# Patient Record
Sex: Female | Born: 1975
Health system: Southern US, Community
[De-identification: ages and names within clinical notes are randomized; demographics above are authoritative.]

## PROBLEM LIST (undated history)

## (undated) DIAGNOSIS — O24419 Gestational diabetes mellitus in pregnancy, unspecified control: Secondary | ICD-10-CM

## (undated) DIAGNOSIS — D649 Anemia, unspecified: Secondary | ICD-10-CM

## (undated) DIAGNOSIS — R002 Palpitations: Secondary | ICD-10-CM

## (undated) DIAGNOSIS — F419 Anxiety disorder, unspecified: Secondary | ICD-10-CM

## (undated) DIAGNOSIS — K589 Irritable bowel syndrome without diarrhea: Secondary | ICD-10-CM

## (undated) HISTORY — DX: Anemia, unspecified: D64.9

## (undated) HISTORY — PX: WISDOM TOOTH EXTRACTION: SHX21

## (undated) HISTORY — DX: Gestational diabetes mellitus in pregnancy, unspecified control: O24.419

## (undated) HISTORY — PX: ABCESS DRAINAGE: SHX399

## (undated) HISTORY — DX: Irritable bowel syndrome, unspecified: K58.9

## (undated) HISTORY — DX: Palpitations: R00.2

## (undated) HISTORY — PX: ANKLE SURGERY: SHX546

---

## 1998-11-16 ENCOUNTER — Other Ambulatory Visit: Admission: RE | Admit: 1998-11-16 | Discharge: 1998-11-16 | Payer: Self-pay | Admitting: *Deleted

## 1999-04-09 ENCOUNTER — Other Ambulatory Visit: Admission: RE | Admit: 1999-04-09 | Discharge: 1999-04-09 | Payer: Self-pay | Admitting: Obstetrics and Gynecology

## 1999-04-12 ENCOUNTER — Other Ambulatory Visit: Admission: RE | Admit: 1999-04-12 | Discharge: 1999-04-12 | Payer: Self-pay | Admitting: Obstetrics and Gynecology

## 2014-02-04 ENCOUNTER — Other Ambulatory Visit: Payer: Self-pay | Admitting: Orthopedic Surgery

## 2014-02-04 DIAGNOSIS — S81009A Unspecified open wound, unspecified knee, initial encounter: Secondary | ICD-10-CM

## 2014-02-04 DIAGNOSIS — S81809A Unspecified open wound, unspecified lower leg, initial encounter: Principal | ICD-10-CM

## 2014-02-04 DIAGNOSIS — S91009A Unspecified open wound, unspecified ankle, initial encounter: Principal | ICD-10-CM

## 2014-02-08 ENCOUNTER — Ambulatory Visit
Admission: RE | Admit: 2014-02-08 | Discharge: 2014-02-08 | Disposition: A | Discharge status: Home / Self Care | Payer: PRIVATE HEALTH INSURANCE | Source: Ambulatory Visit | Attending: Orthopedic Surgery | Admitting: Orthopedic Surgery

## 2014-02-08 DIAGNOSIS — S91009A Unspecified open wound, unspecified ankle, initial encounter: Principal | ICD-10-CM

## 2014-02-08 DIAGNOSIS — S81009A Unspecified open wound, unspecified knee, initial encounter: Secondary | ICD-10-CM

## 2014-02-08 DIAGNOSIS — S81809A Unspecified open wound, unspecified lower leg, initial encounter: Principal | ICD-10-CM

## 2014-11-25 NOTE — L&D Delivery Note (Signed)
Delivery Note  SVD viable nake Apgars 9,9 over 2nd degree ML epis.  Placenta delivered spontaneously intact with 3VC. Repair with 2-0 Chromic with good support and hemostasis noted and R/V exam confirms.  PH art was sent.  [place] cord blood was done.  Mother and baby were doing well.  EBL 375cc  [name], MD

## 2015-02-16 IMAGING — CT CT ANKLE*R* W/O CM
2 of 4 series · 4 of 14 positions shown, 5 images · non-contrast
Comparison: None.

CLINICAL DATA: Ankle fracture with fixation 4 months ago.
Persistent drainage at wound, ceasing over the last week. Evaluate
for nonunion.

EXAM:
CT OF THE RIGHT ANKLE WITHOUT CONTRAST
TECHNIQUE: Multidetector CT imaging was performed according to the standard
protocol. Multiplanar CT image reconstructions were also generated.

[Series 3: ankle /foot detail · axial · 0.39mm/px · z∈[-51,-1]mm · 2 of 62 slices shown, 3 images]
[im 21/62  soft-tissue]
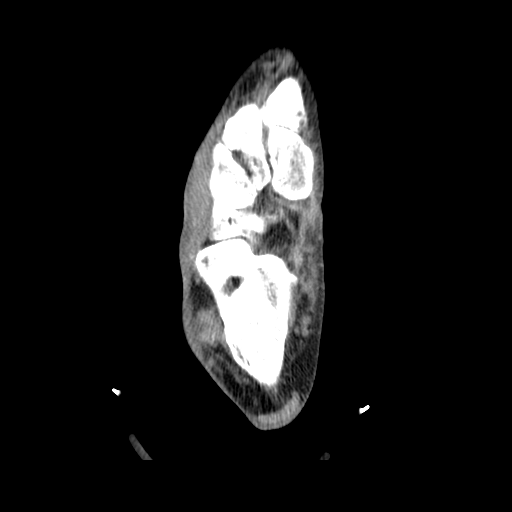
[im 21/62  bone]
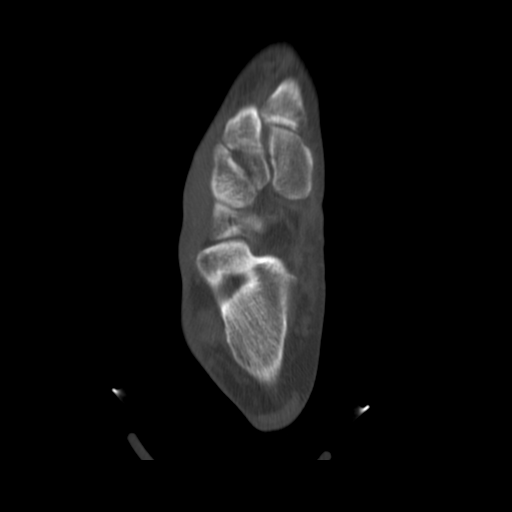
[im 41/62  bone]
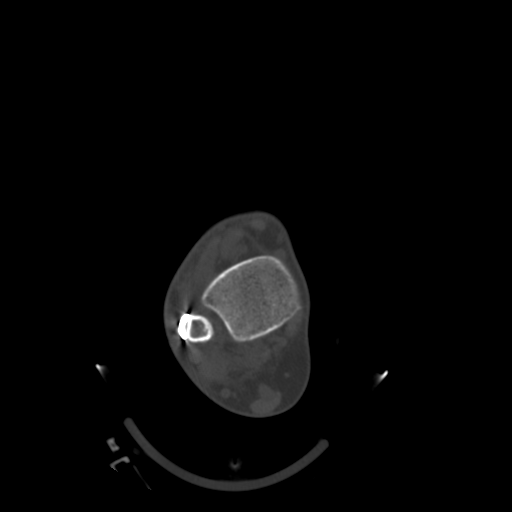

[Series 104: cor soft · coronal · 0.39mm/px · 2 of 65 slices shown]
[im 22/65  soft-tissue]
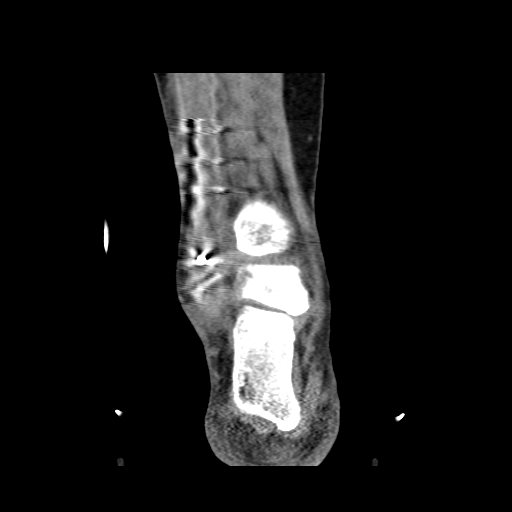
[im 43/65  soft-tissue]
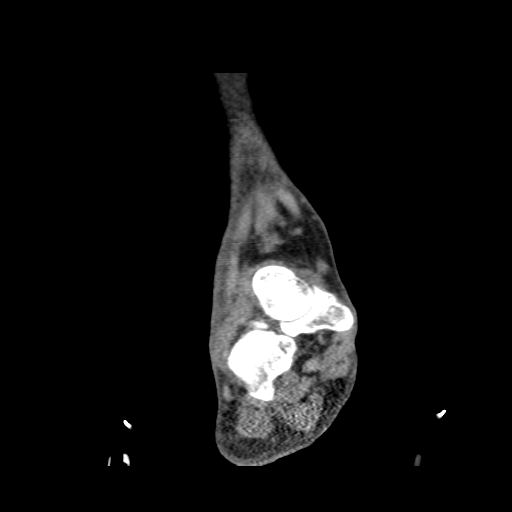

[4 of 14 positions shown; findings below may reference images not displayed]

FINDINGS: Distal fibular lateral plate and screws are well positioned without
loosening. The underlying oblique fracture of the distal fibula
appears largely healed with cortical bridging posteriorly. The
fracture line remains visible anteriorly. There is no fracture
displacement. There is no widening of the ankle mortise or
syndesmosis. Minimal subchondral lucency is present posterolaterally
in the talar dome. The overlying cortex appears intact. The tibial
plafond and and medial malleolus appear normal.

There is no ankle joint effusion or evidence of loose body. The
subtalar joint appears normal.

As evaluated by CT, all of the ankle tendons appear normal. There is
some soft tissue thickening inferior to the fibular tip. There is no
evidence of osseous sub fibular impingement or focal fluid
collection. Type 1 accessory navicular noted incidentally.
IMPRESSION: 1. The fracture of the distal fibula has largely healed with minimal
posttraumatic deformity. There is no evidence of hardware loosening
or displacement.
2. Possible small osteochondral lesion of the talar dome
posterolaterally. No displacement or overlying cortical abnormality
identified.
3. Soft tissue thickening inferior to the fibular tip without
typical distribution for impingement. The peroneal tendons appear
grossly normal.

## 2015-03-15 ENCOUNTER — Inpatient Hospital Stay (HOSPITAL_COMMUNITY)
Admission: AD | Admit: 2015-03-15 | Payer: PRIVATE HEALTH INSURANCE | Source: Ambulatory Visit | Admitting: Obstetrics and Gynecology

## 2015-03-15 LAB — OB RESULTS CONSOLE HIV ANTIBODY (ROUTINE TESTING): HIV: NONREACTIVE

## 2015-03-15 LAB — OB RESULTS CONSOLE HEPATITIS B SURFACE ANTIGEN: HEP B S AG: NEGATIVE

## 2015-03-15 LAB — OB RESULTS CONSOLE RPR: RPR: NONREACTIVE

## 2015-03-15 LAB — OB RESULTS CONSOLE RUBELLA ANTIBODY, IGM: RUBELLA: IMMUNE

## 2015-08-09 ENCOUNTER — Encounter: Payer: Managed Care, Other (non HMO) | Attending: Obstetrics and Gynecology

## 2015-08-09 VITALS — Ht 65.0 in | Wt 162.9 lb

## 2015-08-09 DIAGNOSIS — O24419 Gestational diabetes mellitus in pregnancy, unspecified control: Secondary | ICD-10-CM

## 2015-08-09 DIAGNOSIS — Z713 Dietary counseling and surveillance: Secondary | ICD-10-CM | POA: Insufficient documentation

## 2015-08-10 NOTE — Progress Notes (Signed)
  Patient was seen on 08/09/15 for Gestational Diabetes self-management . The following learning objectives were met by the patient :   States the definition of Gestational Diabetes  States why dietary management is important in controlling blood glucose  Describes the effects of carbohydrates on blood glucose levels  Demonstrates ability to create a balanced meal plan  Demonstrates carbohydrate counting   States when to check blood glucose levels  Demonstrates proper blood glucose monitoring techniques  States the effect of stress and exercise on blood glucose levels  States the importance of limiting caffeine and abstaining from alcohol and smoking  Plan:  Aim for 2 Carb Choices per meal (30 grams) +/- 1 either way for breakfast Aim for 3 Carb Choices per meal (45 grams) +/- 1 either way from lunch and dinner Aim for 1-2 Carbs per snack Begin reading food labels for Total Carbohydrate and sugar grams of foods Consider  increasing your activity level by walking daily as tolerated Begin checking BG before breakfast and 1-2 hours after first bit of breakfast, lunch and dinner after  as directed by MD  Take medication  as directed by MD  Blood glucose monitor given: One touch Verio Flex  Lot # [id number] X Exp: 08/2016 Blood glucose reading: $RemoveBeforeDE'100mg'VkOrsGCrOnCKnVW$ /dl  Patient instructed to monitor glucose levels: FBS: 60 - <90 2 hour: <120  Patient received the following handouts:  Nutrition Diabetes and Pregnancy  Carbohydrate Counting List  Meal Planning worksheet  Patient will be seen for follow-up as needed.

## 2015-10-07 ENCOUNTER — Encounter (HOSPITAL_COMMUNITY): Payer: Self-pay

## 2015-10-07 ENCOUNTER — Observation Stay (HOSPITAL_COMMUNITY)
Admission: AD | Admit: 2015-10-07 | Discharge: 2015-10-08 | Disposition: A | Discharge status: Home / Self Care | Payer: Managed Care, Other (non HMO) | Source: Ambulatory Visit | Attending: Obstetrics and Gynecology | Admitting: Obstetrics and Gynecology

## 2015-10-07 DIAGNOSIS — Z3A38 38 weeks gestation of pregnancy: Secondary | ICD-10-CM | POA: Insufficient documentation

## 2015-10-07 DIAGNOSIS — O24419 Gestational diabetes mellitus in pregnancy, unspecified control: Secondary | ICD-10-CM | POA: Diagnosis not present

## 2015-10-07 DIAGNOSIS — L0231 Cutaneous abscess of buttock: Secondary | ICD-10-CM | POA: Insufficient documentation

## 2015-10-07 DIAGNOSIS — O99713 Diseases of the skin and subcutaneous tissue complicating pregnancy, third trimester: Secondary | ICD-10-CM | POA: Diagnosis present

## 2015-10-07 DIAGNOSIS — O2393 Unspecified genitourinary tract infection in pregnancy, third trimester: Secondary | ICD-10-CM | POA: Diagnosis not present

## 2015-10-07 DIAGNOSIS — O99013 Anemia complicating pregnancy, third trimester: Secondary | ICD-10-CM | POA: Insufficient documentation

## 2015-10-07 DIAGNOSIS — N764 Abscess of vulva: Secondary | ICD-10-CM | POA: Diagnosis present

## 2015-10-07 LAB — CBC
HEMATOCRIT: 33.1 % — AB (ref 36.0–46.0)
HEMOGLOBIN: 11.2 g/dL — AB (ref 12.0–15.0)
MCH: 30.5 pg (ref 26.0–34.0)
MCHC: 33.8 g/dL (ref 30.0–36.0)
MCV: 90.2 fL (ref 78.0–100.0)
Platelets: 174 10*3/uL (ref 150–400)
RBC: 3.67 MIL/uL — ABNORMAL LOW (ref 3.87–5.11)
RDW: 13.9 % (ref 11.5–15.5)
WBC: 13.9 10*3/uL — ABNORMAL HIGH (ref 4.0–10.5)

## 2015-10-07 LAB — GLUCOSE, CAPILLARY
GLUCOSE-CAPILLARY: 152 mg/dL — AB (ref 65–99)
GLUCOSE-CAPILLARY: 87 mg/dL (ref 65–99)

## 2015-10-07 LAB — ABO/RH: ABO/RH(D): O POS

## 2015-10-07 LAB — TYPE AND SCREEN
ABO/RH(D): O POS
ANTIBODY SCREEN: NEGATIVE

## 2015-10-07 MED ORDER — AMPICILLIN-SULBACTAM SODIUM 3 (2-1) G IJ SOLR
3.0000 g | Freq: Four times a day (QID) | INTRAMUSCULAR | Status: DC
  Administered 2015-10-07 – 2015-10-08 (×4): 3 g via INTRAVENOUS
  Filled 2015-10-07 (×5): qty 3

## 2015-10-07 MED ORDER — CALCIUM CARBONATE ANTACID 500 MG PO CHEW: 2.0000 | CHEWABLE_TABLET | ORAL | Status: DC | PRN

## 2015-10-07 MED ORDER — PRENATAL MULTIVITAMIN CH
1.0000 | ORAL_TABLET | Freq: Every day | ORAL | Status: DC
  Administered 2015-10-07 – 2015-10-08 (×2): 1 via ORAL
  Filled 2015-10-07 (×2): qty 1

## 2015-10-07 MED ORDER — ZOLPIDEM TARTRATE 5 MG PO TABS: 5.0000 mg | ORAL_TABLET | Freq: Every evening | ORAL | Status: DC | PRN

## 2015-10-07 MED ORDER — LIDOCAINE HCL (PF) 1 % IJ SOLN
INTRAMUSCULAR | Status: AC
  Filled 2015-10-07: qty 30

## 2015-10-07 MED ORDER — DOCUSATE SODIUM 100 MG PO CAPS
100.0000 mg | ORAL_CAPSULE | Freq: Every day | ORAL | Status: DC
  Administered 2015-10-07 – 2015-10-08 (×2): 100 mg via ORAL
  Filled 2015-10-07 (×2): qty 1

## 2015-10-07 MED ORDER — LACTATED RINGERS IV SOLN
INTRAVENOUS | Status: DC
  Administered 2015-10-07 – 2015-10-08 (×3): via INTRAVENOUS

## 2015-10-07 MED ORDER — ACETAMINOPHEN 325 MG PO TABS: 650.0000 mg | ORAL_TABLET | ORAL | Status: DC | PRN

## 2015-10-07 NOTE — Progress Notes (Signed)
Feeling much better VSS Afeb Continue IV fluids and Unasyn CBC in am

## 2015-10-07 NOTE — MAU Note (Addendum)
Patient presents with boil in groin area that appeared on teusday. Went to the office on Wednesday and was prescribed antibiotics but it didn't help so went back on Friday and was prescribed a different antibiotic and still has not gone away office told her to come to MAU.

## 2015-10-07 NOTE — Progress Notes (Signed)
Abscess of left buttock 3 cm central pocket of purulent material with 6 cm surrounding erythema D/W patient and husband Prepped with betadine Injected with lidocaine Stab I&D of abscess-purulent drainage noted, explored with sterile cotton swab C&S done Patient tolerated it well

## 2015-10-07 NOTE — H&P (Signed)
Joan [name] is a 39 y.o. female presenting for abscess. Patient seen in office this week and noted to have an infection of vulva. She was started on keflex. The infected area grew and became more uncomfortable. Yesterday she was started on Bactrim. No relief. Today the area aches and it hurts to sit. No shaking chills or N/V. Pregnancy complicated by GDM on diet control.  Fasting glucose this am is 93. Maternal Medical History:  Fetal activity: Perceived fetal activity is normal.      OB History    Gravida Para Term Preterm AB TAB SAB Ectopic Multiple Living   1              Past Medical History  Diagnosis Date  . Anemia   . Gestational diabetes mellitus, antepartum    Past Surgical History  Procedure Laterality Date  . Ankle surgery     Family History: family history is not on file. Social History:  reports that she has never smoked. She does not have any smokeless tobacco history on file. Her alcohol and drug histories are not on file.   Prenatal Transfer Tool  Maternal Diabetes: Yes:  Diabetes Type:  Diet controlled Genetic Screening: Normal Maternal Ultrasounds/Referrals: Normal Fetal Ultrasounds or other Referrals:  None Maternal Substance Abuse:  No Significant Maternal Medications:  None Significant Maternal Lab Results:  None Other Comments:  None  Review of Systems  Constitutional: Negative for fever.      Blood pressure 124/78, pulse 142, temperature 98.2 F (36.8 C), temperature source Oral, resp. rate 17. Maternal Exam:  Uterine Assessment: Contraction strength is mild.  Contraction frequency is rare.   Abdomen: Patient reports no abdominal tenderness.   Fetal Exam Fetal State Assessment: Category I - tracings are normal.     Physical Exam  Cardiovascular: Normal rate and regular rhythm.   Respiratory: Effort normal and breath sounds normal.  GI: Soft. There is no tenderness.  Genitourinary:  3 cm abscess of left buttock with 6 cm surrounding  induration Skene's and Bartholin glands normal No inguinal lymphadenopathy  Neurological: She has normal reflexes.    The left buttock abscess is drained of purulent material in MAU. See procedure note. C&S sent.  Prenatal labs: ABO, Rh:   Antibody:   Rubella:   RPR:    HBsAg:    HIV:    GBS:     Assessment/Plan: 39 yo G1P0 @ 38 weeks with left buttock abcess unresponsive to outpatient management-S/P I&D. Will keep for observation IV fluids and Unasyn CBC pending   Joan [name],Joan [name] 10/07/2015, 10:41 AM

## 2015-10-08 ENCOUNTER — Encounter (HOSPITAL_COMMUNITY): Payer: Self-pay | Admitting: Student

## 2015-10-08 DIAGNOSIS — L0231 Cutaneous abscess of buttock: Secondary | ICD-10-CM | POA: Diagnosis present

## 2015-10-08 LAB — GLUCOSE, CAPILLARY
GLUCOSE-CAPILLARY: 71 mg/dL (ref 65–99)
Glucose-Capillary: 146 mg/dL — ABNORMAL HIGH (ref 65–99)

## 2015-10-08 LAB — CBC
HCT: 29.3 % — ABNORMAL LOW (ref 36.0–46.0)
Hemoglobin: 9.9 g/dL — ABNORMAL LOW (ref 12.0–15.0)
MCH: 30.7 pg (ref 26.0–34.0)
MCHC: 33.8 g/dL (ref 30.0–36.0)
MCV: 90.7 fL (ref 78.0–100.0)
PLATELETS: 152 10*3/uL (ref 150–400)
RBC: 3.23 MIL/uL — AB (ref 3.87–5.11)
RDW: 14.1 % (ref 11.5–15.5)
WBC: 9.7 10*3/uL (ref 4.0–10.5)

## 2015-10-08 MED ORDER — AMOXICILLIN-POT CLAVULANATE 875-125 MG PO TABS: 1.0000 | ORAL_TABLET | Freq: Two times a day (BID) | ORAL | Status: DC

## 2015-10-08 NOTE — Progress Notes (Signed)
Report given and care relinquished to F. Morris, RNC. 

## 2015-10-08 NOTE — Discharge Summary (Signed)
Physician Discharge Summary  Patient ID: Joan [name] MRN: [id number] DOB/AGE: 1975/12/15 39 y.o.  Admit date: 10/07/2015 Discharge date: 10/08/2015  Admission Diagnoses:left buttock abscess                                        Pregnancy 37 6/7 wks  Discharge Diagnoses:  Active Problems:   Vulvar abscess   Left buttock abscess   Discharged Condition: good  Hospital Course: admitted for IV fluids and antibiotics. Patient had relief with I&D.   Consults: None  Significant Diagnostic Studies: labs:  Results for orders placed or performed during the hospital encounter of 10/07/15 (from the past 48 hour(s))  CBC     Status: Abnormal   Collection Time: 10/07/15 10:04 AM  Result Value Ref Range   WBC 13.9 (H) 4.0 - 10.5 K/uL   RBC 3.67 (L) 3.87 - 5.11 MIL/uL   Hemoglobin 11.2 (L) 12.0 - 15.0 g/dL   HCT 40.9 (L) 81.1 - 91.4 %   MCV 90.2 78.0 - 100.0 fL   MCH 30.5 26.0 - 34.0 pg   MCHC 33.8 30.0 - 36.0 g/dL   RDW 78.2 95.6 - 21.3 %   Platelets 174 150 - 400 K/uL  ABO/Rh     Status: None   Collection Time: 10/07/15 10:16 AM  Result Value Ref Range   ABO/RH(D) O POS   Culture, routine-abscess     Status: None (Preliminary result)   Collection Time: 10/07/15 10:35 AM  Result Value Ref Range   Specimen Description BUTTOCKS    Special Requests NONE    Gram Stain      FEW WCP RARE SQUAMOUS EPITHELIAL CELLS PRESENT FEW GRAM POSITIVE COCCI IN PAIRS Performed at [facility]    Culture PENDING    Report Status PENDING   Type and screen [facility] [place]     Status: None   Collection Time: 10/07/15 10:57 AM  Result Value Ref Range   ABO/RH(D) O POS    Antibody Screen NEG    Sample Expiration 10/10/2015   Glucose, capillary     Status: Abnormal   Collection Time: 10/07/15  3:50 PM  Result Value Ref Range   Glucose-Capillary 152 (H) 65 - 99 mg/dL   Comment 1 Notify RN   Glucose, capillary     Status: None   Collection Time: 10/07/15  9:09 PM   Result Value Ref Range   Glucose-Capillary 87 65 - 99 mg/dL   Comment 1 Notify RN    Comment 2 Document in Chart   CBC     Status: Abnormal   Collection Time: 10/08/15  5:40 AM  Result Value Ref Range   WBC 9.7 4.0 - 10.5 K/uL   RBC 3.23 (L) 3.87 - 5.11 MIL/uL   Hemoglobin 9.9 (L) 12.0 - 15.0 g/dL   HCT 08.6 (L) 57.8 - 46.9 %   MCV 90.7 78.0 - 100.0 fL   MCH 30.7 26.0 - 34.0 pg   MCHC 33.8 30.0 - 36.0 g/dL   RDW 62.9 52.8 - 41.3 %   Platelets 152 150 - 400 K/uL  Glucose, capillary     Status: None   Collection Time: 10/08/15  5:56 AM  Result Value Ref Range   Glucose-Capillary 71 65 - 99 mg/dL  Glucose, capillary     Status: Abnormal   Collection Time: 10/08/15  9:49 AM  Result  Value Ref Range   Glucose-Capillary 146 (H) 65 - 99 mg/dL   Comment 1 Notify RN     Treatments: IV hydration and antibiotics: Unasyn  Discharge Exam: Blood pressure 107/67, pulse 91, temperature 98.3 F (36.8 C), temperature source Oral, resp. rate 16, height 5\' 5"  (1.651 m), weight 165 lb (74.844 kg). General appearance: alert, cooperative and no distress  Disposition: 01-Home or Self Care     Medication List    TAKE these medications        acetaminophen 325 MG tablet  Commonly known as:  TYLENOL  Take 650 mg by mouth every 6 (six) hours as needed.     amoxicillin-clavulanate 875-125 MG tablet  Commonly known as:  AUGMENTIN  Take 1 tablet by mouth 2 (two) times daily.     OVER THE COUNTER MEDICATION  Take 1 tablet by mouth daily. paitient is taking a iron tablet that she gets at the doctors office not sure of the mg     PRENATAL VITAMINS PO  Take 1 each by mouth daily.         Signed: Retta MacMBLIN II,Joan [name] 10/08/2015, 8:36 PM

## 2015-10-08 NOTE — Progress Notes (Signed)
VS deferred, pt sleeping soundly.

## 2015-10-08 NOTE — Progress Notes (Signed)
Feels good Lesion feels better  VSS Afeb Left buttock abscess-less erythema, 5-6 cm induration-less puffy FHT cat one  Results for orders placed or performed during the hospital encounter of 10/07/15 (from the past 48 hour(s))  CBC     Status: Abnormal   Collection Time: 10/07/15 10:04 AM  Result Value Ref Range   WBC 13.9 (H) 4.0 - 10.5 K/uL   RBC 3.67 (L) 3.87 - 5.11 MIL/uL   Hemoglobin 11.2 (L) 12.0 - 15.0 g/dL   HCT 40.933.1 (L) 81.136.0 - 91.446.0 %   MCV 90.2 78.0 - 100.0 fL   MCH 30.5 26.0 - 34.0 pg   MCHC 33.8 30.0 - 36.0 g/dL   RDW 78.213.9 95.611.5 - 21.315.5 %   Platelets 174 150 - 400 K/uL  ABO/Rh     Status: None   Collection Time: 10/07/15 10:16 AM  Result Value Ref Range   ABO/RH(D) O POS   [facility]     Status: None   Collection Time: 10/07/15 10:57 AM  Result Value Ref Range   ABO/RH(D) O POS    Antibody Screen NEG    Sample Expiration 10/10/2015   Glucose, capillary     Status: Abnormal   Collection Time: 10/07/15  3:50 PM  Result Value Ref Range   Glucose-Capillary 152 (H) 65 - 99 mg/dL   Comment 1 Notify RN   Glucose, capillary     Status: None   Collection Time: 10/07/15  9:09 PM  Result Value Ref Range   Glucose-Capillary 87 65 - 99 mg/dL   Comment 1 Notify RN    Comment 2 Document in Chart   CBC     Status: Abnormal   Collection Time: 10/08/15  5:40 AM  Result Value Ref Range   WBC 9.7 4.0 - 10.5 K/uL   RBC 3.23 (L) 3.87 - 5.11 MIL/uL   Hemoglobin 9.9 (L) 12.0 - 15.0 g/dL   HCT 08.629.3 (L) 57.836.0 - 46.946.0 %   MCV 90.7 78.0 - 100.0 fL   MCH 30.7 26.0 - 34.0 pg   MCHC 33.8 30.0 - 36.0 g/dL   RDW 62.914.1 52.811.5 - 41.315.5 %   Platelets 152 150 - 400 K/uL  Glucose, capillary     Status: None   Collection Time: 10/08/15  5:56 AM  Result Value Ref Range   Glucose-Capillary 71 65 - 99 mg/dL  Glucose, capillary     Status: Abnormal   Collection Time: 10/08/15  9:49 AM  Result Value Ref Range   Glucose-Capillary 146 (H) 65 - 99 mg/dL   Comment 1  Notify RN    C&S of abscess in process  A/P: left buttock abscess-S/P I&D, improved        38 0/7 weeks        D/C home on Augmentin        FU office 3 days        Instructions reviewed

## 2015-10-08 NOTE — Discharge Instructions (Signed)
Fetal Movement Counts  Patient Name: __________________________________________________ Patient Due Date: ____________________  Performing a fetal movement count is highly recommended in high-risk pregnancies, but it is good for every pregnant woman to do. Your health care provider may ask you to start counting fetal movements at 28 weeks of the pregnancy. Fetal movements often increase:  · After eating a full meal.  · After physical activity.  · After eating or drinking something sweet or cold.  · At rest.  Pay attention to when you feel the baby is most active. This will help you notice a pattern of your baby's sleep and wake cycles and what factors contribute to an increase in fetal movement. It is important to perform a fetal movement count at the same time each day when your baby is normally most active.   HOW TO COUNT FETAL MOVEMENTS  1. Find a quiet and comfortable area to sit or lie down on your left side. Lying on your left side provides the best blood and oxygen circulation to your baby.  2. Write down the day and time on a sheet of paper or in a journal.  3. Start counting kicks, flutters, swishes, rolls, or jabs in a 2-hour period. You should feel at least 10 movements within 2 hours.  4. If you do not feel 10 movements in 2 hours, wait 2-3 hours and count again. Look for a change in the pattern or not enough counts in 2 hours.  SEEK MEDICAL CARE IF:  · You feel less than 10 counts in 2 hours, tried twice.  · There is no movement in over an hour.  · The pattern is changing or taking longer each day to reach 10 counts in 2 hours.  · You feel the baby is not moving as he or she usually does.  Date: ____________ Movements: ____________ Start time: ____________ Finish time: ____________   Date: ____________ Movements: ____________ Start time: ____________ Finish time: ____________  Date: ____________ Movements: ____________ Start time: ____________ Finish time: ____________  Date: ____________ Movements:  ____________ Start time: ____________ Finish time: ____________  Date: ____________ Movements: ____________ Start time: ____________ Finish time: ____________  Date: ____________ Movements: ____________ Start time: ____________ Finish time: ____________  Date: ____________ Movements: ____________ Start time: ____________ Finish time: ____________  Date: ____________ Movements: ____________ Start time: ____________ Finish time: ____________   Date: ____________ Movements: ____________ Start time: ____________ Finish time: ____________  Date: ____________ Movements: ____________ Start time: ____________ Finish time: ____________  Date: ____________ Movements: ____________ Start time: ____________ Finish time: ____________  Date: ____________ Movements: ____________ Start time: ____________ Finish time: ____________  Date: ____________ Movements: ____________ Start time: ____________ Finish time: ____________  Date: ____________ Movements: ____________ Start time: ____________ Finish time: ____________  Date: ____________ Movements: ____________ Start time: ____________ Finish time: ____________   Date: ____________ Movements: ____________ Start time: ____________ Finish time: ____________  Date: ____________ Movements: ____________ Start time: ____________ Finish time: ____________  Date: ____________ Movements: ____________ Start time: ____________ Finish time: ____________  Date: ____________ Movements: ____________ Start time: ____________ Finish time: ____________  Date: ____________ Movements: ____________ Start time: ____________ Finish time: ____________  Date: ____________ Movements: ____________ Start time: ____________ Finish time: ____________  Date: ____________ Movements: ____________ Start time: ____________ Finish time: ____________   Date: ____________ Movements: ____________ Start time: ____________ Finish time: ____________  Date: ____________ Movements: ____________ Start time: ____________ Finish  time: ____________  Date: ____________ Movements: ____________ Start time: ____________ Finish time: ____________  Date: ____________ Movements: ____________ Start time:   ____________ Finish time: ____________  Date: ____________ Movements: ____________ Start time: ____________ Finish time: ____________  Date: ____________ Movements: ____________ Start time: ____________ Finish time: ____________  Date: ____________ Movements: ____________ Start time: ____________ Finish time: ____________   Date: ____________ Movements: ____________ Start time: ____________ Finish time: ____________  Date: ____________ Movements: ____________ Start time: ____________ Finish time: ____________  Date: ____________ Movements: ____________ Start time: ____________ Finish time: ____________  Date: ____________ Movements: ____________ Start time: ____________ Finish time: ____________  Date: ____________ Movements: ____________ Start time: ____________ Finish time: ____________  Date: ____________ Movements: ____________ Start time: ____________ Finish time: ____________  Date: ____________ Movements: ____________ Start time: ____________ Finish time: ____________   Date: ____________ Movements: ____________ Start time: ____________ Finish time: ____________  Date: ____________ Movements: ____________ Start time: ____________ Finish time: ____________  Date: ____________ Movements: ____________ Start time: ____________ Finish time: ____________  Date: ____________ Movements: ____________ Start time: ____________ Finish time: ____________  Date: ____________ Movements: ____________ Start time: ____________ Finish time: ____________  Date: ____________ Movements: ____________ Start time: ____________ Finish time: ____________  Date: ____________ Movements: ____________ Start time: ____________ Finish time: ____________   Date: ____________ Movements: ____________ Start time: ____________ Finish time: ____________  Date: ____________  Movements: ____________ Start time: ____________ Finish time: ____________  Date: ____________ Movements: ____________ Start time: ____________ Finish time: ____________  Date: ____________ Movements: ____________ Start time: ____________ Finish time: ____________  Date: ____________ Movements: ____________ Start time: ____________ Finish time: ____________  Date: ____________ Movements: ____________ Start time: ____________ Finish time: ____________  Date: ____________ Movements: ____________ Start time: ____________ Finish time: ____________   Date: ____________ Movements: ____________ Start time: ____________ Finish time: ____________  Date: ____________ Movements: ____________ Start time: ____________ Finish time: ____________  Date: ____________ Movements: ____________ Start time: ____________ Finish time: ____________  Date: ____________ Movements: ____________ Start time: ____________ Finish time: ____________  Date: ____________ Movements: ____________ Start time: ____________ Finish time: ____________  Date: ____________ Movements: ____________ Start time: ____________ Finish time: ____________     This information is not intended to replace advice given to you by your health care provider. Make sure you discuss any questions you have with your health care provider.     Document Released: 12/11/2006 Document Revised: 12/02/2014 Document Reviewed: 09/07/2012  Elsevier Interactive Patient Education ©2016 [employer]

## 2015-10-10 LAB — CULTURE, ROUTINE-ABSCESS

## 2015-10-15 ENCOUNTER — Inpatient Hospital Stay (HOSPITAL_COMMUNITY)
Admission: AD | Admit: 2015-10-15 | Discharge: 2015-10-18 | DRG: 775 | Disposition: A | Discharge status: Home / Self Care | Payer: Managed Care, Other (non HMO) | Source: Ambulatory Visit | Attending: Obstetrics & Gynecology | Admitting: Obstetrics & Gynecology

## 2015-10-15 ENCOUNTER — Encounter (HOSPITAL_COMMUNITY): Payer: Self-pay | Admitting: *Deleted

## 2015-10-15 DIAGNOSIS — O4290 Premature rupture of membranes, unspecified as to length of time between rupture and onset of labor, unspecified weeks of gestation: Secondary | ICD-10-CM | POA: Diagnosis present

## 2015-10-15 DIAGNOSIS — O09519 Supervision of elderly primigravida, unspecified trimester: Secondary | ICD-10-CM

## 2015-10-15 DIAGNOSIS — Z3A39 39 weeks gestation of pregnancy: Secondary | ICD-10-CM | POA: Diagnosis not present

## 2015-10-15 DIAGNOSIS — O4292 Full-term premature rupture of membranes, unspecified as to length of time between rupture and onset of labor: Secondary | ICD-10-CM | POA: Diagnosis present

## 2015-10-15 DIAGNOSIS — O24420 Gestational diabetes mellitus in childbirth, diet controlled: Secondary | ICD-10-CM | POA: Diagnosis present

## 2015-10-15 DIAGNOSIS — O9902 Anemia complicating childbirth: Secondary | ICD-10-CM | POA: Diagnosis present

## 2015-10-15 LAB — CBC
HCT: 32.5 % — ABNORMAL LOW (ref 36.0–46.0)
Hemoglobin: 11 g/dL — ABNORMAL LOW (ref 12.0–15.0)
MCH: 30.8 pg (ref 26.0–34.0)
MCHC: 33.8 g/dL (ref 30.0–36.0)
MCV: 91 fL (ref 78.0–100.0)
PLATELETS: 224 10*3/uL (ref 150–400)
RBC: 3.57 MIL/uL — AB (ref 3.87–5.11)
RDW: 13.9 % (ref 11.5–15.5)
WBC: 11.1 10*3/uL — AB (ref 4.0–10.5)

## 2015-10-15 MED ORDER — LACTATED RINGERS IV SOLN
INTRAVENOUS | Status: DC
  Administered 2015-10-15 – 2015-10-16 (×4): via INTRAVENOUS

## 2015-10-15 NOTE — H&P (Addendum)
Joan JacobsenKelli E [name] is a 39 y.o. female presenting for PROM at 1030pm.  Rare mild CTX.  Active FM.  Antepartum course complicated by AMA; normal NIPT.  A1DM with good control.  GBS negative.  Maternal Medical History:  Reason for admission: Rupture of membranes.   Contractions: Frequency: rare.   Perceived severity is mild.    Fetal activity: Perceived fetal activity is normal.   Last perceived fetal movement was within the past hour.    Prenatal complications: no prenatal complications Prenatal Complications - Diabetes: gestational. Diabetes is managed by diet.      OB History    Gravida Para Term Preterm AB TAB SAB Ectopic Multiple Living   1              Past Medical History  Diagnosis Date  . Anemia   . Gestational diabetes mellitus, antepartum    Past Surgical History  Procedure Laterality Date  . Ankle surgery    . Abcess drainage      left under groin I&D  . Wisdom tooth extraction     Family History: family history is not on file. Social History:  reports that she has never smoked. She does not have any smokeless tobacco history on file. She reports that she does not drink alcohol or use illicit drugs.   Prenatal Transfer Tool  Maternal Diabetes: Yes:  Diabetes Type:  Diet controlled Genetic Screening: Normal Maternal Ultrasounds/Referrals: Normal Fetal Ultrasounds or other Referrals:  None Maternal Substance Abuse:  No Significant Maternal Medications:  None Significant Maternal Lab Results:  Lab values include: Group B Strep negative Other Comments:  None  ROS  Dilation: Fingertip Effacement (%): 40 Blood pressure 132/86, pulse 118, temperature 97.8 F (36.6 C), temperature source Oral, resp. rate 16, height 5\' 5"  (1.651 m), weight 72.576 kg (160 lb). Maternal Exam:  Uterine Assessment: Contraction strength is mild.  Contraction frequency is rare.   Abdomen: Patient reports no abdominal tenderness. Fundal height is c/w dates.   Estimated fetal weight is  7#12.       Physical Exam  Constitutional: She is oriented to person, place, and time. She appears well-developed and well-nourished.  GI: Soft. There is no rebound and no guarding.  Neurological: She is alert and oriented to person, place, and time.  Skin: Skin is warm and dry.  Psychiatric: She has a normal mood and affect. Her behavior is normal.    Prenatal labs: ABO, Rh: --/--/O POS (11/12 1057) Antibody: NEG (11/12 1057) Rubella:   RPR:    HBsAg:    HIV:    GBS:     Assessment/Plan: 38yo G1 at 5031w0d with PROM -Oral VMP -Epidural when ready -Anticipate NSVD -A1DM-monitor glucose q 4 hr.   Joan [name] 10/15/2015, 11:43 PM

## 2015-10-15 NOTE — MAU Note (Signed)
Pt got up off couch and noticed a very large amt of pale pink water coming from her vagina. Reports active fetus.

## 2015-10-16 ENCOUNTER — Inpatient Hospital Stay (HOSPITAL_COMMUNITY): Payer: Managed Care, Other (non HMO) | Admitting: Anesthesiology

## 2015-10-16 ENCOUNTER — Encounter (HOSPITAL_COMMUNITY): Payer: Self-pay | Admitting: *Deleted

## 2015-10-16 DIAGNOSIS — O4290 Premature rupture of membranes, unspecified as to length of time between rupture and onset of labor, unspecified weeks of gestation: Secondary | ICD-10-CM | POA: Diagnosis present

## 2015-10-16 LAB — GLUCOSE, CAPILLARY
GLUCOSE-CAPILLARY: 89 mg/dL (ref 65–99)
GLUCOSE-CAPILLARY: 96 mg/dL (ref 65–99)
Glucose-Capillary: 118 mg/dL — ABNORMAL HIGH (ref 65–99)
Glucose-Capillary: 119 mg/dL — ABNORMAL HIGH (ref 65–99)
Glucose-Capillary: 94 mg/dL (ref 65–99)

## 2015-10-16 LAB — TYPE AND SCREEN
ABO/RH(D): O POS
Antibody Screen: NEGATIVE

## 2015-10-16 LAB — OB RESULTS CONSOLE GBS: GBS: NEGATIVE

## 2015-10-16 LAB — RPR: RPR: NONREACTIVE

## 2015-10-16 MED ORDER — LACTATED RINGERS IV SOLN
500.0000 mL | INTRAVENOUS | Status: DC | PRN
Start: 2015-10-16 — End: 2015-10-16

## 2015-10-16 MED ORDER — DIPHENHYDRAMINE HCL 25 MG PO CAPS: 25.0000 mg | ORAL_CAPSULE | Freq: Four times a day (QID) | ORAL | Status: DC | PRN

## 2015-10-16 MED ORDER — OXYTOCIN BOLUS FROM INFUSION
500.0000 mL | INTRAVENOUS | Status: DC
  Administered 2015-10-16: 500 mL via INTRAVENOUS

## 2015-10-16 MED ORDER — SENNOSIDES-DOCUSATE SODIUM 8.6-50 MG PO TABS
2.0000 | ORAL_TABLET | ORAL | Status: DC
  Administered 2015-10-17 – 2015-10-18 (×2): 2 via ORAL
  Filled 2015-10-16: qty 2

## 2015-10-16 MED ORDER — LIDOCAINE HCL (PF) 1 % IJ SOLN
INTRAMUSCULAR | Status: DC | PRN
  Administered 2015-10-16: 5 mL

## 2015-10-16 MED ORDER — ACETAMINOPHEN 325 MG PO TABS: 650.0000 mg | ORAL_TABLET | ORAL | Status: DC | PRN

## 2015-10-16 MED ORDER — LIDOCAINE-EPINEPHRINE (PF) 2 %-1:200000 IJ SOLN
INTRAMUSCULAR | Status: DC | PRN
  Administered 2015-10-16: 4 mL

## 2015-10-16 MED ORDER — FLEET ENEMA 7-19 GM/118ML RE ENEM: 1.0000 | ENEMA | RECTAL | Status: DC | PRN

## 2015-10-16 MED ORDER — TETANUS-DIPHTH-ACELL PERTUSSIS 5-2.5-18.5 LF-MCG/0.5 IM SUSP: 0.5000 mL | Freq: Once | INTRAMUSCULAR | Status: DC

## 2015-10-16 MED ORDER — SIMETHICONE 80 MG PO CHEW: 80.0000 mg | CHEWABLE_TABLET | ORAL | Status: DC | PRN

## 2015-10-16 MED ORDER — ONDANSETRON HCL 4 MG/2ML IJ SOLN
4.0000 mg | INTRAMUSCULAR | Status: DC | PRN
Start: 2015-10-16 — End: 2015-10-18

## 2015-10-16 MED ORDER — PRENATAL MULTIVITAMIN CH
1.0000 | ORAL_TABLET | Freq: Every day | ORAL | Status: DC
  Administered 2015-10-17 – 2015-10-18 (×2): 1 via ORAL
  Filled 2015-10-16 (×2): qty 1

## 2015-10-16 MED ORDER — OXYCODONE-ACETAMINOPHEN 5-325 MG PO TABS: 1.0000 | ORAL_TABLET | ORAL | Status: DC | PRN

## 2015-10-16 MED ORDER — DIBUCAINE 1 % RE OINT
1.0000 "application " | TOPICAL_OINTMENT | RECTAL | Status: DC | PRN
  Administered 2015-10-17: 1 via RECTAL
  Filled 2015-10-16: qty 28

## 2015-10-16 MED ORDER — FENTANYL 2.5 MCG/ML BUPIVACAINE 1/10 % EPIDURAL INFUSION (WH - ANES)
14.0000 mL/h | INTRAMUSCULAR | Status: DC | PRN
  Administered 2015-10-16 (×3): 14 mL/h via EPIDURAL
  Filled 2015-10-16 (×3): qty 125

## 2015-10-16 MED ORDER — MEASLES, MUMPS & RUBELLA VAC ~~LOC~~ INJ
0.5000 mL | INJECTION | Freq: Once | SUBCUTANEOUS | Status: DC
  Filled 2015-10-16: qty 0.5

## 2015-10-16 MED ORDER — OXYTOCIN 40 UNITS IN LACTATED RINGERS INFUSION - SIMPLE MED
62.5000 mL/h | INTRAVENOUS | Status: DC
  Administered 2015-10-16: 62.5 mL/h via INTRAVENOUS
  Filled 2015-10-16: qty 1000

## 2015-10-16 MED ORDER — PHENYLEPHRINE 40 MCG/ML (10ML) SYRINGE FOR IV PUSH (FOR BLOOD PRESSURE SUPPORT)
80.0000 ug | PREFILLED_SYRINGE | INTRAVENOUS | Status: DC | PRN
  Filled 2015-10-16: qty 2
  Filled 2015-10-16: qty 20

## 2015-10-16 MED ORDER — LANOLIN HYDROUS EX OINT: TOPICAL_OINTMENT | CUTANEOUS | Status: DC | PRN

## 2015-10-16 MED ORDER — MEDROXYPROGESTERONE ACETATE 150 MG/ML IM SUSP: 150.0000 mg | INTRAMUSCULAR | Status: DC | PRN

## 2015-10-16 MED ORDER — BUTORPHANOL TARTRATE 1 MG/ML IJ SOLN
1.0000 mg | INTRAMUSCULAR | Status: DC | PRN
  Administered 2015-10-16: 1 mg via INTRAVENOUS
  Filled 2015-10-16: qty 1

## 2015-10-16 MED ORDER — ZOLPIDEM TARTRATE 5 MG PO TABS: 5.0000 mg | ORAL_TABLET | Freq: Every evening | ORAL | Status: DC | PRN

## 2015-10-16 MED ORDER — TERBUTALINE SULFATE 1 MG/ML IJ SOLN
0.2500 mg | Freq: Once | INTRAMUSCULAR | Status: DC | PRN
  Filled 2015-10-16: qty 1

## 2015-10-16 MED ORDER — IBUPROFEN 600 MG PO TABS
600.0000 mg | ORAL_TABLET | Freq: Four times a day (QID) | ORAL | Status: DC
  Administered 2015-10-17 – 2015-10-18 (×7): 600 mg via ORAL
  Filled 2015-10-16 (×7): qty 1

## 2015-10-16 MED ORDER — EPHEDRINE 5 MG/ML INJ
10.0000 mg | INTRAVENOUS | Status: DC | PRN
  Filled 2015-10-16: qty 2

## 2015-10-16 MED ORDER — ONDANSETRON HCL 4 MG/2ML IJ SOLN
4.0000 mg | Freq: Four times a day (QID) | INTRAMUSCULAR | Status: DC | PRN
  Administered 2015-10-16: 4 mg via INTRAVENOUS
  Filled 2015-10-16: qty 2

## 2015-10-16 MED ORDER — BUPIVACAINE HCL (PF) 0.25 % IJ SOLN
INTRAMUSCULAR | Status: DC | PRN
  Administered 2015-10-16 (×2): 4 mL via EPIDURAL

## 2015-10-16 MED ORDER — BENZOCAINE-MENTHOL 20-0.5 % EX AERO
1.0000 "application " | INHALATION_SPRAY | CUTANEOUS | Status: DC | PRN
  Administered 2015-10-17: 1 via TOPICAL
  Filled 2015-10-16: qty 56

## 2015-10-16 MED ORDER — OXYTOCIN 40 UNITS IN LACTATED RINGERS INFUSION - SIMPLE MED
1.0000 m[IU]/min | INTRAVENOUS | Status: DC
  Administered 2015-10-16: 2 m[IU]/min via INTRAVENOUS

## 2015-10-16 MED ORDER — OXYCODONE-ACETAMINOPHEN 5-325 MG PO TABS: 2.0000 | ORAL_TABLET | ORAL | Status: DC | PRN

## 2015-10-16 MED ORDER — WITCH HAZEL-GLYCERIN EX PADS
1.0000 "application " | MEDICATED_PAD | CUTANEOUS | Status: DC | PRN
  Administered 2015-10-17: 1 via TOPICAL

## 2015-10-16 MED ORDER — LIDOCAINE HCL (PF) 1 % IJ SOLN
30.0000 mL | INTRAMUSCULAR | Status: DC | PRN
  Filled 2015-10-16: qty 30

## 2015-10-16 MED ORDER — ONDANSETRON HCL 4 MG PO TABS: 4.0000 mg | ORAL_TABLET | ORAL | Status: DC | PRN

## 2015-10-16 MED ORDER — TERBUTALINE SULFATE 1 MG/ML IJ SOLN
0.2500 mg | Freq: Once | INTRAMUSCULAR | Status: DC | PRN
Start: 2015-10-16 — End: 2015-10-16
  Filled 2015-10-16: qty 1

## 2015-10-16 MED ORDER — DIPHENHYDRAMINE HCL 50 MG/ML IJ SOLN: 12.5000 mg | INTRAMUSCULAR | Status: DC | PRN

## 2015-10-16 MED ORDER — CITRIC ACID-SODIUM CITRATE 334-500 MG/5ML PO SOLN: 30.0000 mL | ORAL | Status: DC | PRN

## 2015-10-16 MED ORDER — MISOPROSTOL 200 MCG PO TABS
50.0000 ug | ORAL_TABLET | ORAL | Status: DC
  Administered 2015-10-16: 50 ug via ORAL
  Filled 2015-10-16: qty 0.5

## 2015-10-16 NOTE — Anesthesia Procedure Notes (Signed)
Epidural Patient location during procedure: OB  Staffing Anesthesiologist: Akif Weldy Performed by: anesthesiologist   Preanesthetic Checklist Completed: patient identified, surgical consent, pre-op evaluation, timeout performed, IV checked, risks and benefits discussed and monitors and equipment checked  Epidural Patient position: sitting Prep: DuraPrep Patient monitoring: heart rate, cardiac monitor, continuous pulse ox and blood pressure Approach: midline Location: L3-L4 Injection technique: LOR saline  Needle:  Needle type: Tuohy  Needle gauge: 17 G Needle length: 9 cm Needle insertion depth: 6 cm Catheter type: closed end flexible Catheter size: 19 Gauge Catheter at skin depth: 12 cm Test dose: negative and 2% lidocaine with Epi 1:200 K  Assessment Events: blood not aspirated, injection not painful, no injection resistance, negative IV test and no paresthesia  Additional Notes Reason for block:procedure for pain   

## 2015-10-16 NOTE — Progress Notes (Signed)
Patient ID: Joan JacobsenKelli E [name], female   DOB: 1975/12/09, 39 y.o.   MRN: [id number] CTSP for recurrent late decels Position change and D/C pitocin FHR now cat 1 with accels and no further decels Cts q 3-5 Cx 5/80/-2 Vtx IUPC Placed  Now with Reassuring FHR If remains Cat 1 restart Pitocin if not adequate DL

## 2015-10-16 NOTE — Anesthesia Preprocedure Evaluation (Signed)
Anesthesia Evaluation  Patient identified by MRN, date of birth, ID band Patient awake    Reviewed: Allergy & Precautions, Patient's Chart, lab work & pertinent test results  History of Anesthesia Complications Negative for: history of anesthetic complications  Airway Mallampati: I  TM Distance: >3 FB Neck ROM: Full    Dental  (+) Teeth Intact   Pulmonary neg pulmonary ROS,    breath sounds clear to auscultation       Cardiovascular negative cardio ROS   Rhythm:Regular     Neuro/Psych negative neurological ROS  negative psych ROS   GI/Hepatic negative GI ROS, Neg liver ROS,   Endo/Other  diabetes, Gestational  Renal/GU negative Renal ROS     Musculoskeletal   Abdominal   Peds  Hematology  (+) anemia ,   Anesthesia Other Findings   Reproductive/Obstetrics (+) Pregnancy                             Anesthesia Physical Anesthesia Plan  ASA: II  Anesthesia Plan: Epidural   Post-op Pain Management:    Induction:   Airway Management Planned:   Additional Equipment:   Intra-op Plan:   Post-operative Plan:   Informed Consent: I have reviewed the patients History and Physical, chart, labs and discussed the procedure including the risks, benefits and alternatives for the proposed anesthesia with the patient or authorized representative who has indicated his/her understanding and acceptance.     Plan Discussed with: Anesthesiologist  Anesthesia Plan Comments:         Anesthesia Quick Evaluation

## 2015-10-17 LAB — CBC
HCT: 24.2 % — ABNORMAL LOW (ref 36.0–46.0)
HEMOGLOBIN: 8.4 g/dL — AB (ref 12.0–15.0)
MCH: 31.3 pg (ref 26.0–34.0)
MCHC: 34.7 g/dL (ref 30.0–36.0)
MCV: 90.3 fL (ref 78.0–100.0)
Platelets: 170 10*3/uL (ref 150–400)
RBC: 2.68 MIL/uL — AB (ref 3.87–5.11)
RDW: 14.2 % (ref 11.5–15.5)
WBC: 19.3 10*3/uL — AB (ref 4.0–10.5)

## 2015-10-17 LAB — CCBB MATERNAL DONOR DRAW

## 2015-10-17 NOTE — Addendum Note (Signed)
Addendum  created 10/17/15 [id number] by Collier FlowersElizabeth J Trice Aspinall, CRNA   Modules edited: Charges VN, Clinical Notes   Clinical Notes:  File: [id number]

## 2015-10-17 NOTE — Lactation Note (Signed)
This note was copied from the chart of Boy Jiles GarterKelli Schlotzhauer. Lactation Consultation Note Woke baby up to BF d/t last blood glucose 42. Baby sleepy, needs much stimulation to suckle on breast. Finally latched, but little occassional suckling. Hand expressed breast while in baby's mouth. Fell asleep while on breast. Hand expressed 1 ml and gave w/syring and gloved finger. No suckle swallow coordination noted. Mom doing STS. Mom has personal DEBP. Encouraged to initiate pumping to stimulate breast until baby starts BF better. Reported to RN. Baby had large void in diaper. Nose very congested. May not can breathe well w/stuffy nose while BF.? Patient Name: Boy Jiles GarterKelli Staller JYNWG'NToday's Date: 10/17/2015 Reason for consult: Follow-up assessment;Other (Comment) (low blood sugar)   Maternal Data Has patient been taught Hand Expression?: Yes Does the patient have breastfeeding experience prior to this delivery?: No  Feeding Feeding Type: Breast Milk Length of feed: 2 min  LATCH Score/Interventions Latch: Repeated attempts needed to sustain latch, nipple held in mouth throughout feeding, stimulation needed to elicit sucking reflex. Intervention(s): Skin to skin;Waking techniques Intervention(s): Adjust position;Assist with latch;Breast massage;Breast compression  Audible Swallowing: None Intervention(s): Hand expression;Skin to skin Intervention(s): Skin to skin;Hand expression;Alternate breast massage  Type of Nipple: Everted at rest and after stimulation  Comfort (Breast/Nipple): Soft / non-tender     Hold (Positioning): Assistance needed to correctly position infant at breast and maintain latch. Intervention(s): Skin to skin;Position options;Support Pillows;Breastfeeding basics reviewed  LATCH Score: 6  Lactation Tools Discussed/Used Tools: Pump Breast pump type: Double-Electric Breast Pump WIC Program: No Pump Review: Setup, frequency, and cleaning;Milk Storage Initiated by:: Peri JeffersonL. Markis Langland  RN Date initiated:: 10/17/15   Consult Status Consult Status: Follow-up Date: 10/17/15 Follow-up type: In-patient    Charyl DancerCARVER, Elisheba Mcdonnell G 10/17/2015, 6:58 AM

## 2015-10-17 NOTE — Lactation Note (Signed)
This note was copied from the chart of Joan Jiles GarterKelli Sweeting. Lactation Consultation Note New mom w/everted nipples. Assisted in latching in football hold. Hand expression taught collected 1 ml colostrum. Baby had 2 low blood sugars 36, 38. Just came to draw another lab glucose. With much stimulation baby latched and BF for 5 minutes. Mom denied pain. Baby has recessed chin, demonstrated chin tug. Noted cupping in tongue slightly.  Mom encouraged to feed baby 8-12 times/24 hours and with feeding cues. Mom encouraged to waken baby for feeds. Monitor for jitteriness, or extreme sleepiness. Mom encouraged to do skin-to-skin. Educated about newborn behavior, I&O, supply and demand. Encouraged STS.WH/LC brochure given w/resources, support groups and LC services.   Patient Name: Joan [name] AOZHY'QToday's Date: 10/17/2015 Reason for consult: Initial assessment   Maternal Data Has patient been taught Hand Expression?: Yes Does the patient have breastfeeding experience prior to this delivery?: No  Feeding Feeding Type: Breast Milk Length of feed: 5 min  LATCH Score/Interventions Latch: Repeated attempts needed to sustain latch, nipple held in mouth throughout feeding, stimulation needed to elicit sucking reflex. Intervention(s): Adjust position;Assist with latch;Breast massage;Breast compression  Audible Swallowing: A few with stimulation Intervention(s): Skin to skin;Hand expression;Alternate breast massage  Type of Nipple: Everted at rest and after stimulation  Comfort (Breast/Nipple): Soft / non-tender     Hold (Positioning): Assistance needed to correctly position infant at breast and maintain latch. Intervention(s): Skin to skin;Position options;Support Pillows;Breastfeeding basics reviewed  LATCH Score: 7  Lactation Tools Discussed/Used WIC Program: No   Consult Status Consult Status: Follow-up Date: 10/17/15 Follow-up type: In-patient    [name], Diamond NickelLAURA G 10/17/2015, 5:30  AM

## 2015-10-17 NOTE — Anesthesia Postprocedure Evaluation (Signed)
Anesthesia Post Note  Patient: Joan JacobsenKelli E [name]  Procedure(s) Performed: * No procedures listed *  Patient location during evaluation: Mother Baby Anesthesia Type: Epidural Level of consciousness: awake and alert, oriented and patient cooperative Pain management: pain level controlled Vital Signs Assessment: post-procedure vital signs reviewed and stable Respiratory status: spontaneous breathing Cardiovascular status: blood pressure returned to baseline and stable Postop Assessment: No headache, No backache, No signs of nausea or vomiting and Adequate PO intake Anesthetic complications: no    Last Vitals:  Filed Vitals:   10/17/15 0210 10/17/15 0559  BP: 102/62 110/79  Pulse: 112 79  Temp: 37 C 36.8 C  Resp: 18 18    Last Pain:  Filed Vitals:   10/17/15 0805  PainSc: 0-No pain                 Priscille Shadduck

## 2015-10-17 NOTE — Progress Notes (Signed)
Post Partum Day 1 Subjective: no complaints, up ad lib, voiding and tolerating PO  Objective: Blood pressure 110/79, pulse 79, temperature 98.2 F (36.8 C), temperature source Oral, resp. rate 18, height 5\' 5"  (1.651 m), weight 160 lb (72.576 kg), SpO2 99 %, unknown if currently breastfeeding.  Physical Exam:  General: alert and cooperative Lochia: appropriate Uterine Fundus: firm Incision: healing well DVT Evaluation: No evidence of DVT seen on physical exam. Negative Homan's sign. No cords or calf tenderness. No significant calf/ankle edema.   Recent Labs  10/15/15 2315 10/17/15 0535  HGB 11.0* 8.4*  HCT 32.5* 24.2*    Assessment/Plan: Plan for discharge tomorrow. Does not desire circ   LOS: 2 days   CURTIS,CAROL G 10/17/2015, 7:56 AM

## 2015-10-18 NOTE — Discharge Summary (Signed)
Obstetric Discharge Summary Reason for Admission: rupture of membranes Prenatal Procedures: ultrasound Intrapartum Procedures: spontaneous vaginal delivery Postpartum Procedures: none Complications-Operative and Postpartum: 2 degree perineal laceration HEMOGLOBIN  Date Value Ref Range Status  10/17/2015 8.4* 12.0 - 15.0 [name]/dL Final    Comment:    REPEATED TO VERIFY DELTA CHECK NOTED    HCT  Date Value Ref Range Status  10/17/2015 24.2* 36.0 - 46.0 % Final    Physical Exam:  General: alert and cooperative Lochia: appropriate Uterine Fundus: firm Incision: healing well DVT Evaluation: No evidence of DVT seen on physical exam. Negative Homan's sign. No cords or calf tenderness. No significant calf/ankle edema.  Discharge Diagnoses: Term Pregnancy-delivered  Discharge Information: Date: 10/18/2015 Activity: pelvic rest Diet: routine Medications: PNV and Ibuprofen Condition: stable Instructions: refer to practice specific booklet Discharge to: home   Newborn Data: Live born female  Birth Weight: 7 lb 12.2 oz (3521 [name]) APGAR: 9, 9  Home with mother.  Joan [name] 10/18/2015, 7:58 AM

## 2015-11-08 ENCOUNTER — Ambulatory Visit (HOSPITAL_COMMUNITY)
Admission: RE | Admit: 2015-11-08 | Discharge: 2015-11-08 | Disposition: A | Discharge status: Home / Self Care | Payer: Managed Care, Other (non HMO) | Source: Ambulatory Visit | Attending: Obstetrics and Gynecology | Admitting: Obstetrics and Gynecology

## 2015-11-08 NOTE — Lactation Note (Signed)
Lactation Consult for Joan [name] (mother) and Joan [name] (DOB: [birth year])  Mother's reason for visit: "Joan [name] lost 2 ounces between nurse visits." Consult:  Initial Lactation Consultant:  Remigio Eisenmengerichey, Yassir Enis Hamilton  ________________________________________________________________________ BW: 3521g (7# 12.2oz) D/c wt: 7% 5.1oz (down 5.7%) Wt on 10-31-15: 7# 1oz [Smart Start visit] Wt on 11-06-15: 6# 14.5oz [Smart Start visit] Today's wt: 6# 15.6oz (10% below BW) ________________________________________________________________________  Mother's Name: Leota JacobsenKelli E Salyers Type of delivery:  Vag Breastfeeding Experience:  primip Maternal Medical Conditions:  Gestational diabetes mellitis Maternal Medications: PNV Has been on a fenugreek/multi-herb galactagogue supplement for less than 1 week. (610mg  of fenugreek/2caps). Mom has been taking 3 caps/day.  _________________________________  Breastfeeding History (Post Discharge)  Frequency of breastfeeding: q2.5-3h Duration of feeding: 30 min on each side  Supplementation  Baby recently begun formula supplementation yesterday (1 oz after feeds). Joan [name] received a total of 4 oz of Enfamil yesterday.   Method:  Bottle  Infant Intake and Output Assessment  Voids:6-7 in 24 hrs.  Color:  Clear yellow Stools: 1 in 24 hrs.  Color:  Yellow  ________________________________________________________________________  Maternal Breast Assessment  Breast:  Soft Nipple:  Erect  _______________________________________________________________________ Feeding Assessment/Evaluation  Initial feeding assessment:  Infant's oral assessment:  WNL  Attached assessment:  Deep  Lips flanged:  Yes.     Suck assessment: See below.  Tools:  Supplemental nutrition system (starter SNS) Instructed on use and cleaning of tool:  Yes.    Pre-feed weight: 3164 g  Post-feed weight: 3190 g  Amount transferred: 8 ml  Amount supplemented: 18 ml (formula  via SNS) Intake for this feeding: 26mL  Pre-feed weight: 3190 g  Post-feed weight: 3220 g  Amount transferred: 8 ml Amount supplemented: 22 ml (formula via SNS) Intake for this feeding: 30mL  Total amount transferred: 16 ml Total supplement given: 40 ml (via SNS)  Joan [name] is 10% below BW at 523 weeks of age. The impetus for the parents making their appt was that Joan [name] had lost 2 oz (in 1 week's time) between his Smart Start visits.   Mom's breasts were noted to be softer than expected. Mom reports that her milk came in slowly and did not come in a surge like she had been expecting. Joan [name] latched w/ease. However, his swallows were infrequent with his suck: swallow ratio ranging from 4:1 to 15+:1. Mom is able to identify swallows and she, too, was able to note the prolonged ratio.  A starter SNS was added w/4540mL of Enfamil. He took it with ease and his suck:swallow ratio improved to 1:1 or 2:1. At times the SNS tube would become displaced and his swallows would decrease, but we (the mother or myself) would change its placement and he continued to do well. If parents would like to continue with using an SNS at the breast, they can return and buy the double SNS, as it is more durable, holds more supplement, and is easier to use.  Plan: 1. Use the starter SNS w/feedings at the breast. Add 50mL of formula to the bottle & increase amount as needed. 2. Pump for 15 min as you are able. 3. Increase dosage of fenugreek. If it doesn't work after 3 days at a therapeutic dosage, then consider moringa/malunggay. (Note:  Mom has been taking a non-therapeutic dosage of fenugreek for less than 1 week. Mom will add a fenugreek-only supplement to her current supplement to achieve a therapeutic dosage to determine its efficacy).   Mom has low milk supply.  I am confident that Joan [name]'s weight will improve quickly w/regular supplementation. Mom may return for an [facility] outpatient visit to monitor her milk supply as she  desires.   Glenetta Hew, RN, IBCLC

## 2017-03-10 DIAGNOSIS — R3129 Other microscopic hematuria: Secondary | ICD-10-CM | POA: Diagnosis not present

## 2017-07-16 DIAGNOSIS — R8299 Other abnormal findings in urine: Secondary | ICD-10-CM | POA: Diagnosis not present

## 2017-07-16 DIAGNOSIS — N390 Urinary tract infection, site not specified: Secondary | ICD-10-CM | POA: Diagnosis not present

## 2017-07-16 DIAGNOSIS — Z0000 Encounter for general adult medical examination without abnormal findings: Secondary | ICD-10-CM | POA: Diagnosis not present

## 2017-07-22 DIAGNOSIS — R002 Palpitations: Secondary | ICD-10-CM | POA: Diagnosis not present

## 2017-07-22 DIAGNOSIS — N920 Excessive and frequent menstruation with regular cycle: Secondary | ICD-10-CM | POA: Diagnosis not present

## 2017-07-22 DIAGNOSIS — Z0000 Encounter for general adult medical examination without abnormal findings: Secondary | ICD-10-CM | POA: Diagnosis not present

## 2017-07-22 DIAGNOSIS — E611 Iron deficiency: Secondary | ICD-10-CM | POA: Diagnosis not present

## 2017-07-22 DIAGNOSIS — M5416 Radiculopathy, lumbar region: Secondary | ICD-10-CM | POA: Diagnosis not present

## 2017-07-22 DIAGNOSIS — Z1389 Encounter for screening for other disorder: Secondary | ICD-10-CM | POA: Diagnosis not present

## 2018-01-13 DIAGNOSIS — Z01419 Encounter for gynecological examination (general) (routine) without abnormal findings: Secondary | ICD-10-CM | POA: Diagnosis not present

## 2018-01-13 DIAGNOSIS — Z6822 Body mass index (BMI) 22.0-22.9, adult: Secondary | ICD-10-CM | POA: Diagnosis not present

## 2018-01-22 DIAGNOSIS — M79646 Pain in unspecified finger(s): Secondary | ICD-10-CM | POA: Diagnosis not present

## 2018-01-22 DIAGNOSIS — Z6821 Body mass index (BMI) 21.0-21.9, adult: Secondary | ICD-10-CM | POA: Diagnosis not present

## 2018-07-13 DIAGNOSIS — S92514A Nondisplaced fracture of proximal phalanx of right lesser toe(s), initial encounter for closed fracture: Secondary | ICD-10-CM | POA: Diagnosis not present

## 2018-10-10 DIAGNOSIS — L03211 Cellulitis of face: Secondary | ICD-10-CM | POA: Diagnosis not present

## 2018-10-26 DIAGNOSIS — Z0000 Encounter for general adult medical examination without abnormal findings: Secondary | ICD-10-CM | POA: Diagnosis not present

## 2018-11-02 DIAGNOSIS — N920 Excessive and frequent menstruation with regular cycle: Secondary | ICD-10-CM | POA: Diagnosis not present

## 2018-11-02 DIAGNOSIS — E611 Iron deficiency: Secondary | ICD-10-CM | POA: Diagnosis not present

## 2018-11-02 DIAGNOSIS — M79646 Pain in unspecified finger(s): Secondary | ICD-10-CM | POA: Diagnosis not present

## 2018-11-02 DIAGNOSIS — Z0000 Encounter for general adult medical examination without abnormal findings: Secondary | ICD-10-CM | POA: Diagnosis not present

## 2018-11-02 DIAGNOSIS — R82998 Other abnormal findings in urine: Secondary | ICD-10-CM | POA: Diagnosis not present

## 2018-11-02 DIAGNOSIS — Z23 Encounter for immunization: Secondary | ICD-10-CM | POA: Diagnosis not present

## 2018-11-02 DIAGNOSIS — Z1389 Encounter for screening for other disorder: Secondary | ICD-10-CM | POA: Diagnosis not present

## 2018-11-17 DIAGNOSIS — R52 Pain, unspecified: Secondary | ICD-10-CM | POA: Diagnosis not present

## 2018-11-17 DIAGNOSIS — K2900 Acute gastritis without bleeding: Secondary | ICD-10-CM | POA: Diagnosis not present

## 2018-11-17 DIAGNOSIS — Z6821 Body mass index (BMI) 21.0-21.9, adult: Secondary | ICD-10-CM | POA: Diagnosis not present

## 2018-12-09 DIAGNOSIS — L810 Postinflammatory hyperpigmentation: Secondary | ICD-10-CM | POA: Diagnosis not present

## 2018-12-09 DIAGNOSIS — L720 Epidermal cyst: Secondary | ICD-10-CM | POA: Diagnosis not present

## 2019-05-10 DIAGNOSIS — Z20818 Contact with and (suspected) exposure to other bacterial communicable diseases: Secondary | ICD-10-CM | POA: Diagnosis not present

## 2019-05-10 DIAGNOSIS — Z20828 Contact with and (suspected) exposure to other viral communicable diseases: Secondary | ICD-10-CM | POA: Diagnosis not present

## 2019-05-10 DIAGNOSIS — J029 Acute pharyngitis, unspecified: Secondary | ICD-10-CM | POA: Diagnosis not present

## 2019-05-14 DIAGNOSIS — Z3040 Encounter for surveillance of contraceptives, unspecified: Secondary | ICD-10-CM | POA: Diagnosis not present

## 2019-05-14 DIAGNOSIS — Z1231 Encounter for screening mammogram for malignant neoplasm of breast: Secondary | ICD-10-CM | POA: Diagnosis not present

## 2019-05-14 DIAGNOSIS — Z6821 Body mass index (BMI) 21.0-21.9, adult: Secondary | ICD-10-CM | POA: Diagnosis not present

## 2019-05-14 DIAGNOSIS — Z01419 Encounter for gynecological examination (general) (routine) without abnormal findings: Secondary | ICD-10-CM | POA: Diagnosis not present

## 2019-05-26 DIAGNOSIS — L570 Actinic keratosis: Secondary | ICD-10-CM | POA: Diagnosis not present

## 2019-06-11 DIAGNOSIS — L570 Actinic keratosis: Secondary | ICD-10-CM | POA: Diagnosis not present

## 2019-09-16 ENCOUNTER — Other Ambulatory Visit: Payer: Self-pay

## 2019-09-16 DIAGNOSIS — Z20822 Contact with and (suspected) exposure to covid-19: Secondary | ICD-10-CM

## 2019-09-18 LAB — NOVEL CORONAVIRUS, NAA: SARS-CoV-2, NAA: NOT DETECTED

## 2019-09-22 ENCOUNTER — Other Ambulatory Visit: Payer: Self-pay

## 2019-09-22 DIAGNOSIS — Z20822 Contact with and (suspected) exposure to covid-19: Secondary | ICD-10-CM

## 2019-09-23 LAB — NOVEL CORONAVIRUS, NAA: SARS-CoV-2, NAA: NOT DETECTED

## 2019-10-01 ENCOUNTER — Emergency Department (HOSPITAL_COMMUNITY)
Admission: EM | Admit: 2019-10-01 | Discharge: 2019-10-02 | Disposition: A | Discharge status: Home / Self Care | Payer: BC Managed Care – PPO | Attending: Emergency Medicine | Admitting: Emergency Medicine

## 2019-10-01 ENCOUNTER — Other Ambulatory Visit: Payer: Self-pay

## 2019-10-01 ENCOUNTER — Encounter (HOSPITAL_COMMUNITY): Payer: Self-pay | Admitting: Emergency Medicine

## 2019-10-01 ENCOUNTER — Emergency Department (HOSPITAL_COMMUNITY): Payer: BC Managed Care – PPO

## 2019-10-01 DIAGNOSIS — R072 Precordial pain: Secondary | ICD-10-CM | POA: Insufficient documentation

## 2019-10-01 DIAGNOSIS — R0602 Shortness of breath: Secondary | ICD-10-CM | POA: Diagnosis not present

## 2019-10-01 DIAGNOSIS — R079 Chest pain, unspecified: Secondary | ICD-10-CM | POA: Diagnosis not present

## 2019-10-01 DIAGNOSIS — R0789 Other chest pain: Secondary | ICD-10-CM | POA: Diagnosis not present

## 2019-10-01 HISTORY — DX: Anxiety disorder, unspecified: F41.9

## 2019-10-01 LAB — TROPONIN I (HIGH SENSITIVITY)
Troponin I (High Sensitivity): 4 ng/L (ref <18)
Troponin I (High Sensitivity): 4 ng/L (ref <18)

## 2019-10-01 LAB — CBC
HCT: 34.8 % — ABNORMAL LOW (ref 36.0–46.0)
Hemoglobin: 11.6 g/dL — ABNORMAL LOW (ref 12.0–15.0)
MCH: 30.2 pg (ref 26.0–34.0)
MCHC: 33.3 g/dL (ref 30.0–36.0)
MCV: 90.6 fL (ref 80.0–100.0)
Platelets: 245 10*3/uL (ref 150–400)
RBC: 3.84 MIL/uL — ABNORMAL LOW (ref 3.87–5.11)
RDW: 12.4 % (ref 11.5–15.5)
WBC: 7.7 10*3/uL (ref 4.0–10.5)
nRBC: 0 % (ref 0.0–0.2)

## 2019-10-01 LAB — BASIC METABOLIC PANEL
Anion gap: 12 (ref 5–15)
BUN: 9 mg/dL (ref 6–20)
CO2: 20 mmol/L — ABNORMAL LOW (ref 22–32)
Calcium: 8.6 mg/dL — ABNORMAL LOW (ref 8.9–10.3)
Chloride: 98 mmol/L (ref 98–111)
Creatinine, Ser: 0.7 mg/dL (ref 0.44–1.00)
GFR calc Af Amer: >60 mL/min (ref >60)
GFR calc non Af Amer: >60 mL/min (ref >60)
Glucose, Bld: 103 mg/dL — ABNORMAL HIGH (ref 70–99)
Potassium: 3.6 mmol/L (ref 3.5–5.1)
Sodium: 130 mmol/L — ABNORMAL LOW (ref 135–145)

## 2019-10-01 LAB — I-STAT BETA HCG BLOOD, ED (MC, WL, AP ONLY): I-stat hCG, quantitative: <5 m[IU]/mL (ref <5)

## 2019-10-01 MED ORDER — SODIUM CHLORIDE 0.9% FLUSH: 3.0000 mL | Freq: Once | INTRAVENOUS | Status: DC

## 2019-10-01 NOTE — ED Triage Notes (Signed)
Pt reports left sided chest pain that started about one hour ago that radiates to her left shoulder. Pt reports it feels like a pressure causing some sob, also dry heaving/cough. Hx of anxiety. Pt reports this doesn't feel like a panic attack but she took a Clonazepam with no relief.

## 2019-10-02 NOTE — ED Notes (Signed)
Patient verbalizes understanding of discharge instructions. Opportunity for questioning and answers were provided. Armband removed by staff, pt discharged from ED. Pt. ambulatory and discharged home.  

## 2019-10-02 NOTE — Discharge Instructions (Addendum)

## 2019-10-02 NOTE — ED Provider Notes (Signed)
[facility] Note   CSN: [id number] Arrival date & time: 10/01/19  1649     History   Chief Complaint Chief Complaint  Patient presents with  . Chest Pain  . Shortness of Breath    HPI Joan JacobsenKelli E [name] is a 43 y.o. female.  HPI: A 43 year old patient presents for evaluation of chest pain. Initial onset of pain was more than 6 hours ago. The patient's chest pain is sharp and is not worse with exertion. The patient's chest pain is middle- or left-sided, is not well-localized, is not described as heaviness/pressure/tightness and does radiate to the arms/jaw/neck. The patient does not complain of nausea and denies diaphoresis. The patient has no history of stroke, has no history of peripheral artery disease, has not smoked in the past 90 days, denies any history of treated diabetes, has no relevant family history of coronary artery disease (first degree relative at less than age 41), is not hypertensive, has no history of hypercholesterolemia and does not have an elevated BMI (>=30).   The history is provided by the patient.   Patient reports chest pain.  She reports episode of left-sided sharp chest pain that goes into her shoulder.  It is not pleuritic.  She also reports shortness of breath. She reports history of panic attacks but this seems different.  She also had an episode while sleeping the previous night that resolved spontaneously. She also reports when the pain became severe she started to dry heave and had a cough.  No fevers.  She has had recent negative Covid testing  No history of CAD/PE Past Medical History:  Diagnosis Date  . Anemia   . Anxiety   . Gestational diabetes mellitus, antepartum     Patient Active Problem List   Diagnosis Date Noted  . PROM (premature rupture of membranes) 10/16/2015  . Left buttock abscess 10/08/2015  . Vulvar abscess 10/07/2015    Past Surgical History:  Procedure Laterality Date  .  ABCESS DRAINAGE     left under groin I&D  . ANKLE SURGERY    . WISDOM TOOTH EXTRACTION       OB History    Gravida  1   Para  1   Term  1   Preterm      AB      Living  1     SAB      TAB      Ectopic      Multiple  0   Live Births  1            Home Medications    Prior to Admission medications   Medication Sig Start Date End Date Taking? Authorizing Provider  Prenatal Multivit-Min-Fe-FA (PRENATAL VITAMINS PO) Take 1 each by mouth daily.    [provider]    Family History No family history on file.  Social History Social History   Tobacco Use  . Smoking status: Never Smoker  . Smokeless tobacco: Never Used  Substance Use Topics  . Alcohol use: Yes    Comment: socially  . Drug use: No     Allergies   Patient has no known allergies.   Review of Systems Review of Systems  Constitutional: Negative for fever.  Respiratory: Positive for shortness of breath.   Cardiovascular: Positive for chest pain. Negative for leg swelling.  Gastrointestinal: Positive for nausea.  Neurological: Negative for syncope.  Psychiatric/Behavioral: The patient is nervous/anxious.   All other systems  reviewed and are negative.    Physical Exam Updated Vital Signs BP 121/79   Pulse 80   Temp 98.2 F (36.8 C) (Oral)   Resp 18   Ht 1.651 m (5\' 5" )   Wt 59 kg   LMP 09/24/2019 (Approximate)   SpO2 99%   BMI 21.63 kg/m   Physical Exam CONSTITUTIONAL: Well developed/well nourished HEAD: Normocephalic/atraumatic EYES: EOMI/PERRL ENMT: Mucous membranes moist NECK: supple no meningeal signs SPINE/BACK:entire spine nontender CV: S1/S2 noted, no murmurs/rubs/gallops noted LUNGS: Lungs are clear to auscultation bilaterally, no apparent distress ABDOMEN: soft, nontender, no rebound or guarding, bowel sounds noted throughout abdomen GU:no cva tenderness NEURO: Pt is awake/alert/appropriate, moves all extremitiesx4.  No facial droop.   EXTREMITIES:  pulses normal/equal, full ROM, no lower extremity edema or tenderness SKIN: warm, color normal PSYCH: no abnormalities of mood noted, alert and oriented to situation   ED Treatments / Results  Labs (all labs ordered are listed, but only abnormal results are displayed) Labs Reviewed  BASIC METABOLIC PANEL - Abnormal; Notable for the following components:      Result Value   Sodium 130 (*)    CO2 20 (*)    Glucose, Bld 103 (*)    Calcium 8.6 (*)    All other components within normal limits  CBC - Abnormal; Notable for the following components:   RBC 3.84 (*)    Hemoglobin 11.6 (*)    HCT 34.8 (*)    All other components within normal limits  I-STAT BETA HCG BLOOD, ED ([facility], [facility], [facility] ONLY)  TROPONIN I (HIGH SENSITIVITY)  TROPONIN I (HIGH SENSITIVITY)    EKG EKG Interpretation  Date/Time:  Friday October 01 2019 16:57:28 EST Ventricular Rate:  82 PR Interval:  134 QRS Duration: 92 QT Interval:  380 QTC Calculation: 443 R Axis:   85 Text Interpretation: Normal sinus rhythm T wave abnormality, consider inferior ischemia Abnormal ECG Interpretation limited secondary to artifact will need repeat ekg Confirmed by Ripley Fraise [phone]) on 10/02/2019 12:03:40 AM  Repeat EKG: ED ECG REPORT   Date: 10/02/2019 00:11  Rate: 82  Rhythm: normal sinus rhythm  QRS Axis: normal  Intervals: normal  ST/T Wave abnormalities: normal  Conduction Disutrbances:none  Narrative Interpretation:    I have personally reviewed the EKG tracing and agree with the computerized printout as noted.   Radiology Dg Chest 2 View  Result Date: 10/01/2019 CLINICAL DATA:  Chest pain EXAM: CHEST - 2 VIEW COMPARISON:  None. FINDINGS: The heart size and mediastinal contours are within normal limits. Both lungs are clear. The visualized skeletal structures are unremarkable. IMPRESSION: No acute abnormality of the lungs. Electronically Signed   By: Eddie Candle M.D.   On: 10/01/2019 17:20    Procedures  Procedures   Medications Ordered in ED Medications  sodium chloride flush (NS) 0.9 % injection 3 mL (has no administration in time range)     Initial Impression / Assessment and Plan / ED Course  I have reviewed the triage vital signs and the nursing notes.  Pertinent labs & imaging results that were available during my care of the patient were reviewed by me and considered in my medical decision making (see chart for details).     HEAR Score: 1  Patient well-appearing.  Low hear score with negative troponins, low suspicion for ACS.  No hypoxia or tachycardia to suggest PE.  She already reports feeling improved. I feel she is appropriate discharge home.  Does have evidence of mild  dehydration with low sodium, but this can be corrected as an outpatient Patient agreed with plan  Final Clinical Impressions(s) / ED Diagnoses   Final diagnoses:  Precordial pain    ED Discharge Orders    None       Zadie Rhine, MD 10/02/19 [phone]

## 2019-10-12 DIAGNOSIS — Z20828 Contact with and (suspected) exposure to other viral communicable diseases: Secondary | ICD-10-CM | POA: Diagnosis not present

## 2019-10-26 DIAGNOSIS — Z20818 Contact with and (suspected) exposure to other bacterial communicable diseases: Secondary | ICD-10-CM | POA: Diagnosis not present

## 2019-10-30 DIAGNOSIS — Z20828 Contact with and (suspected) exposure to other viral communicable diseases: Secondary | ICD-10-CM | POA: Diagnosis not present

## 2019-12-07 ENCOUNTER — Ambulatory Visit: Payer: BC Managed Care – PPO | Attending: Internal Medicine

## 2019-12-07 DIAGNOSIS — Z20822 Contact with and (suspected) exposure to covid-19: Secondary | ICD-10-CM | POA: Diagnosis not present

## 2019-12-09 LAB — NOVEL CORONAVIRUS, NAA: SARS-CoV-2, NAA: NOT DETECTED

## 2019-12-16 DIAGNOSIS — E611 Iron deficiency: Secondary | ICD-10-CM | POA: Diagnosis not present

## 2019-12-16 DIAGNOSIS — N920 Excessive and frequent menstruation with regular cycle: Secondary | ICD-10-CM | POA: Diagnosis not present

## 2019-12-16 DIAGNOSIS — F411 Generalized anxiety disorder: Secondary | ICD-10-CM | POA: Diagnosis not present

## 2019-12-16 DIAGNOSIS — J302 Other seasonal allergic rhinitis: Secondary | ICD-10-CM | POA: Diagnosis not present

## 2019-12-20 DIAGNOSIS — F329 Major depressive disorder, single episode, unspecified: Secondary | ICD-10-CM | POA: Diagnosis not present

## 2019-12-20 DIAGNOSIS — F411 Generalized anxiety disorder: Secondary | ICD-10-CM | POA: Diagnosis not present

## 2019-12-21 ENCOUNTER — Ambulatory Visit: Payer: BC Managed Care – PPO

## 2019-12-22 ENCOUNTER — Ambulatory Visit: Payer: BC Managed Care – PPO | Attending: Internal Medicine

## 2019-12-22 DIAGNOSIS — Z20822 Contact with and (suspected) exposure to covid-19: Secondary | ICD-10-CM

## 2019-12-22 DIAGNOSIS — E611 Iron deficiency: Secondary | ICD-10-CM | POA: Diagnosis not present

## 2019-12-22 DIAGNOSIS — Z0000 Encounter for general adult medical examination without abnormal findings: Secondary | ICD-10-CM | POA: Diagnosis not present

## 2019-12-22 DIAGNOSIS — O24419 Gestational diabetes mellitus in pregnancy, unspecified control: Secondary | ICD-10-CM | POA: Diagnosis not present

## 2019-12-22 DIAGNOSIS — N920 Excessive and frequent menstruation with regular cycle: Secondary | ICD-10-CM | POA: Diagnosis not present

## 2019-12-23 LAB — NOVEL CORONAVIRUS, NAA: SARS-CoV-2, NAA: NOT DETECTED

## 2019-12-29 DIAGNOSIS — Z0000 Encounter for general adult medical examination without abnormal findings: Secondary | ICD-10-CM | POA: Diagnosis not present

## 2019-12-29 DIAGNOSIS — F329 Major depressive disorder, single episode, unspecified: Secondary | ICD-10-CM | POA: Diagnosis not present

## 2019-12-29 DIAGNOSIS — N920 Excessive and frequent menstruation with regular cycle: Secondary | ICD-10-CM | POA: Diagnosis not present

## 2019-12-29 DIAGNOSIS — D7589 Other specified diseases of blood and blood-forming organs: Secondary | ICD-10-CM | POA: Diagnosis not present

## 2019-12-29 DIAGNOSIS — R319 Hematuria, unspecified: Secondary | ICD-10-CM | POA: Diagnosis not present

## 2019-12-29 DIAGNOSIS — F411 Generalized anxiety disorder: Secondary | ICD-10-CM | POA: Diagnosis not present

## 2019-12-29 DIAGNOSIS — Z1331 Encounter for screening for depression: Secondary | ICD-10-CM | POA: Diagnosis not present

## 2020-01-11 DIAGNOSIS — F4010 Social phobia, unspecified: Secondary | ICD-10-CM | POA: Diagnosis not present

## 2020-01-11 DIAGNOSIS — F411 Generalized anxiety disorder: Secondary | ICD-10-CM | POA: Diagnosis not present

## 2020-01-31 DIAGNOSIS — M25571 Pain in right ankle and joints of right foot: Secondary | ICD-10-CM | POA: Diagnosis not present

## 2020-01-31 DIAGNOSIS — M7671 Peroneal tendinitis, right leg: Secondary | ICD-10-CM | POA: Diagnosis not present

## 2020-02-11 DIAGNOSIS — M7671 Peroneal tendinitis, right leg: Secondary | ICD-10-CM | POA: Diagnosis not present

## 2020-02-22 DIAGNOSIS — R52 Pain, unspecified: Secondary | ICD-10-CM | POA: Diagnosis not present

## 2020-02-22 DIAGNOSIS — Z1152 Encounter for screening for COVID-19: Secondary | ICD-10-CM | POA: Diagnosis not present

## 2020-02-22 DIAGNOSIS — B349 Viral infection, unspecified: Secondary | ICD-10-CM | POA: Diagnosis not present

## 2020-02-22 DIAGNOSIS — R110 Nausea: Secondary | ICD-10-CM | POA: Diagnosis not present

## 2020-02-22 DIAGNOSIS — J302 Other seasonal allergic rhinitis: Secondary | ICD-10-CM | POA: Diagnosis not present

## 2020-02-25 DIAGNOSIS — F411 Generalized anxiety disorder: Secondary | ICD-10-CM | POA: Diagnosis not present

## 2020-03-07 DIAGNOSIS — F411 Generalized anxiety disorder: Secondary | ICD-10-CM | POA: Diagnosis not present

## 2020-03-07 DIAGNOSIS — F4010 Social phobia, unspecified: Secondary | ICD-10-CM | POA: Diagnosis not present

## 2020-03-08 DIAGNOSIS — F411 Generalized anxiety disorder: Secondary | ICD-10-CM | POA: Diagnosis not present

## 2020-03-28 DIAGNOSIS — F411 Generalized anxiety disorder: Secondary | ICD-10-CM | POA: Diagnosis not present

## 2020-04-19 DIAGNOSIS — F411 Generalized anxiety disorder: Secondary | ICD-10-CM | POA: Diagnosis not present

## 2020-05-01 DIAGNOSIS — F411 Generalized anxiety disorder: Secondary | ICD-10-CM | POA: Diagnosis not present

## 2020-05-04 DIAGNOSIS — L814 Other melanin hyperpigmentation: Secondary | ICD-10-CM | POA: Diagnosis not present

## 2020-05-04 DIAGNOSIS — D2272 Melanocytic nevi of left lower limb, including hip: Secondary | ICD-10-CM | POA: Diagnosis not present

## 2020-05-04 DIAGNOSIS — D1801 Hemangioma of skin and subcutaneous tissue: Secondary | ICD-10-CM | POA: Diagnosis not present

## 2020-05-04 DIAGNOSIS — D2262 Melanocytic nevi of left upper limb, including shoulder: Secondary | ICD-10-CM | POA: Diagnosis not present

## 2020-05-17 DIAGNOSIS — R635 Abnormal weight gain: Secondary | ICD-10-CM | POA: Diagnosis not present

## 2020-05-17 DIAGNOSIS — Z01419 Encounter for gynecological examination (general) (routine) without abnormal findings: Secondary | ICD-10-CM | POA: Diagnosis not present

## 2020-05-17 DIAGNOSIS — Z6824 Body mass index (BMI) 24.0-24.9, adult: Secondary | ICD-10-CM | POA: Diagnosis not present

## 2020-05-17 DIAGNOSIS — Z1231 Encounter for screening mammogram for malignant neoplasm of breast: Secondary | ICD-10-CM | POA: Diagnosis not present

## 2020-10-08 IMAGING — CR DG CHEST 2V
2 series · 2 of 2 positions shown · non-contrast
Comparison: None.

CLINICAL DATA: Chest pain

EXAM:
CHEST - 2 VIEW

[chest pa]
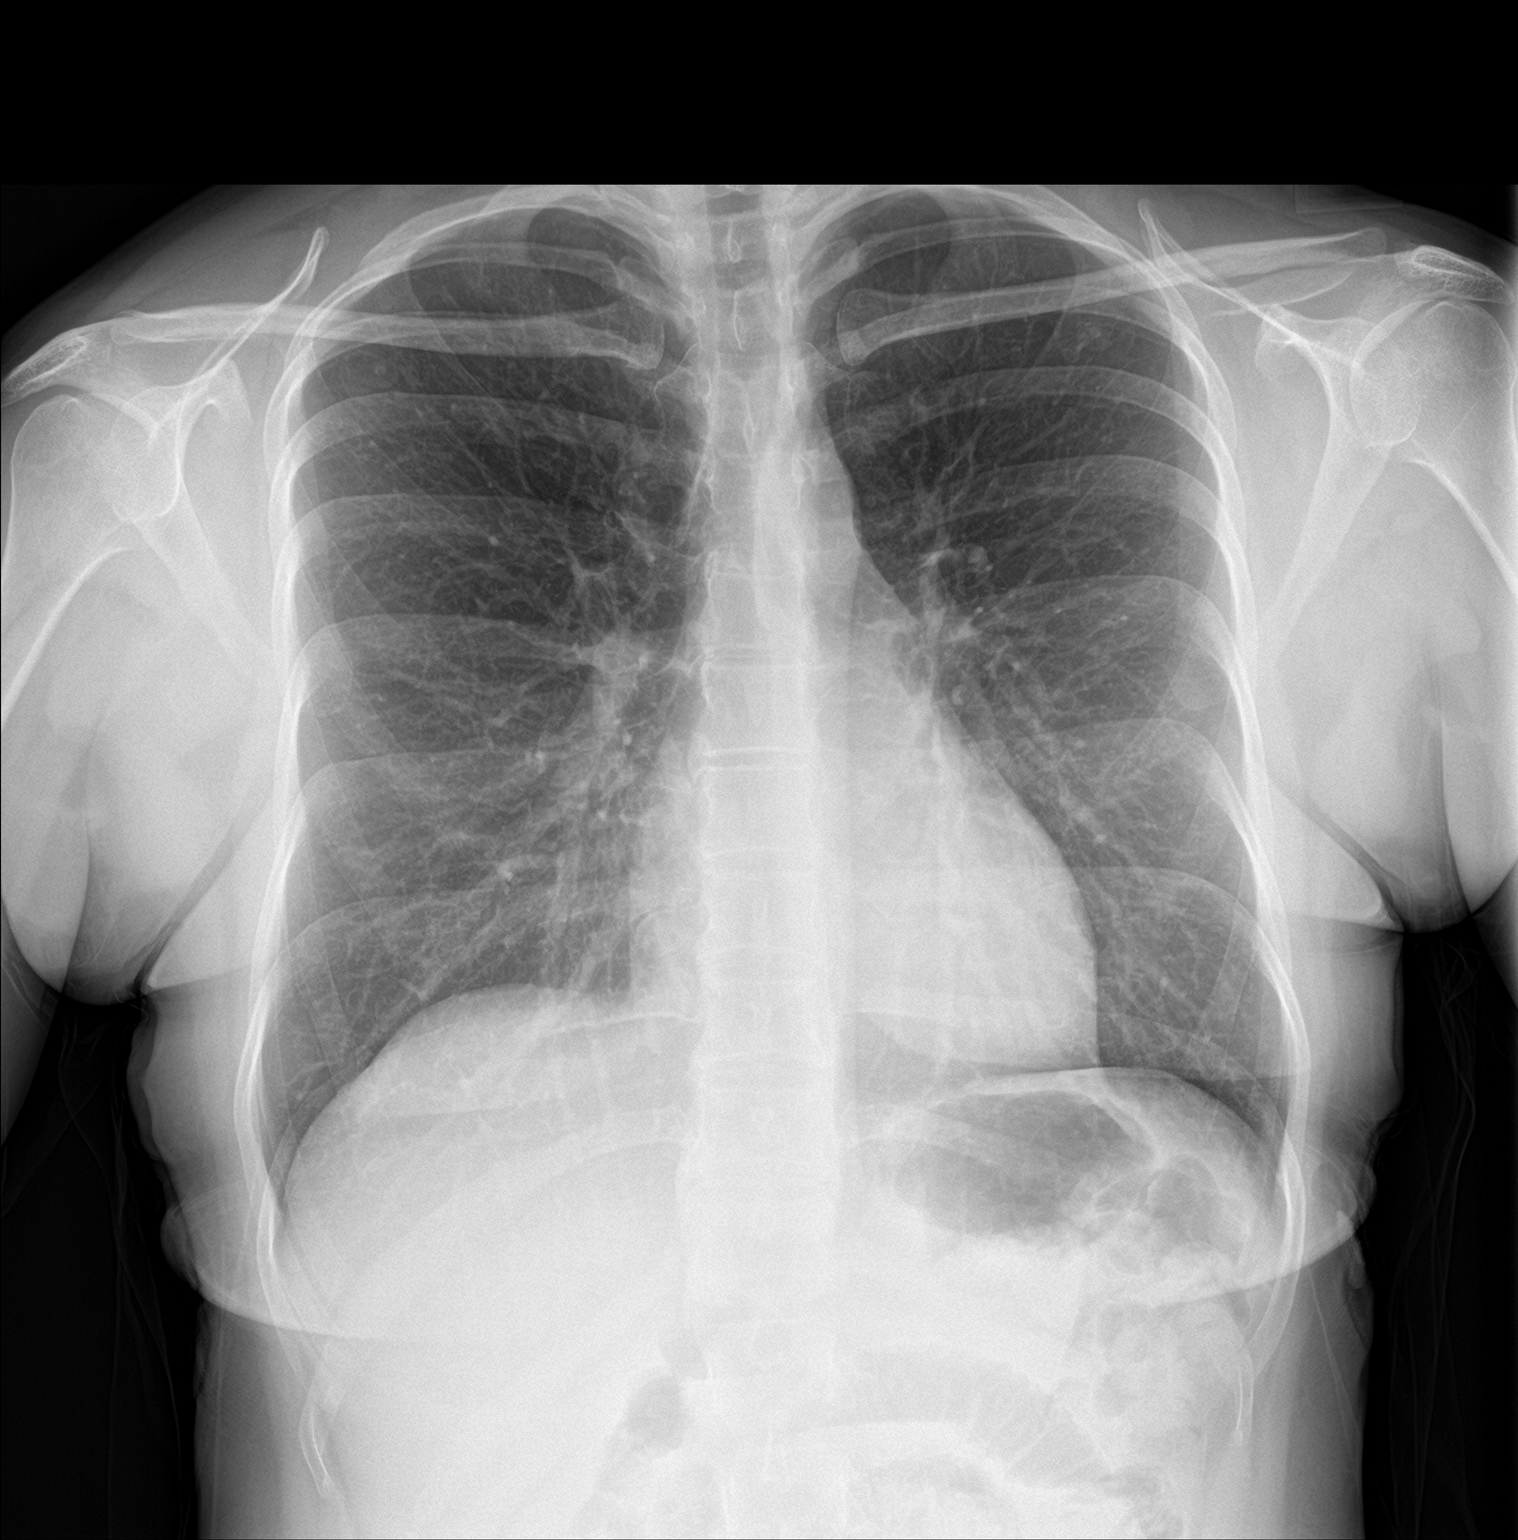

[chest lat]
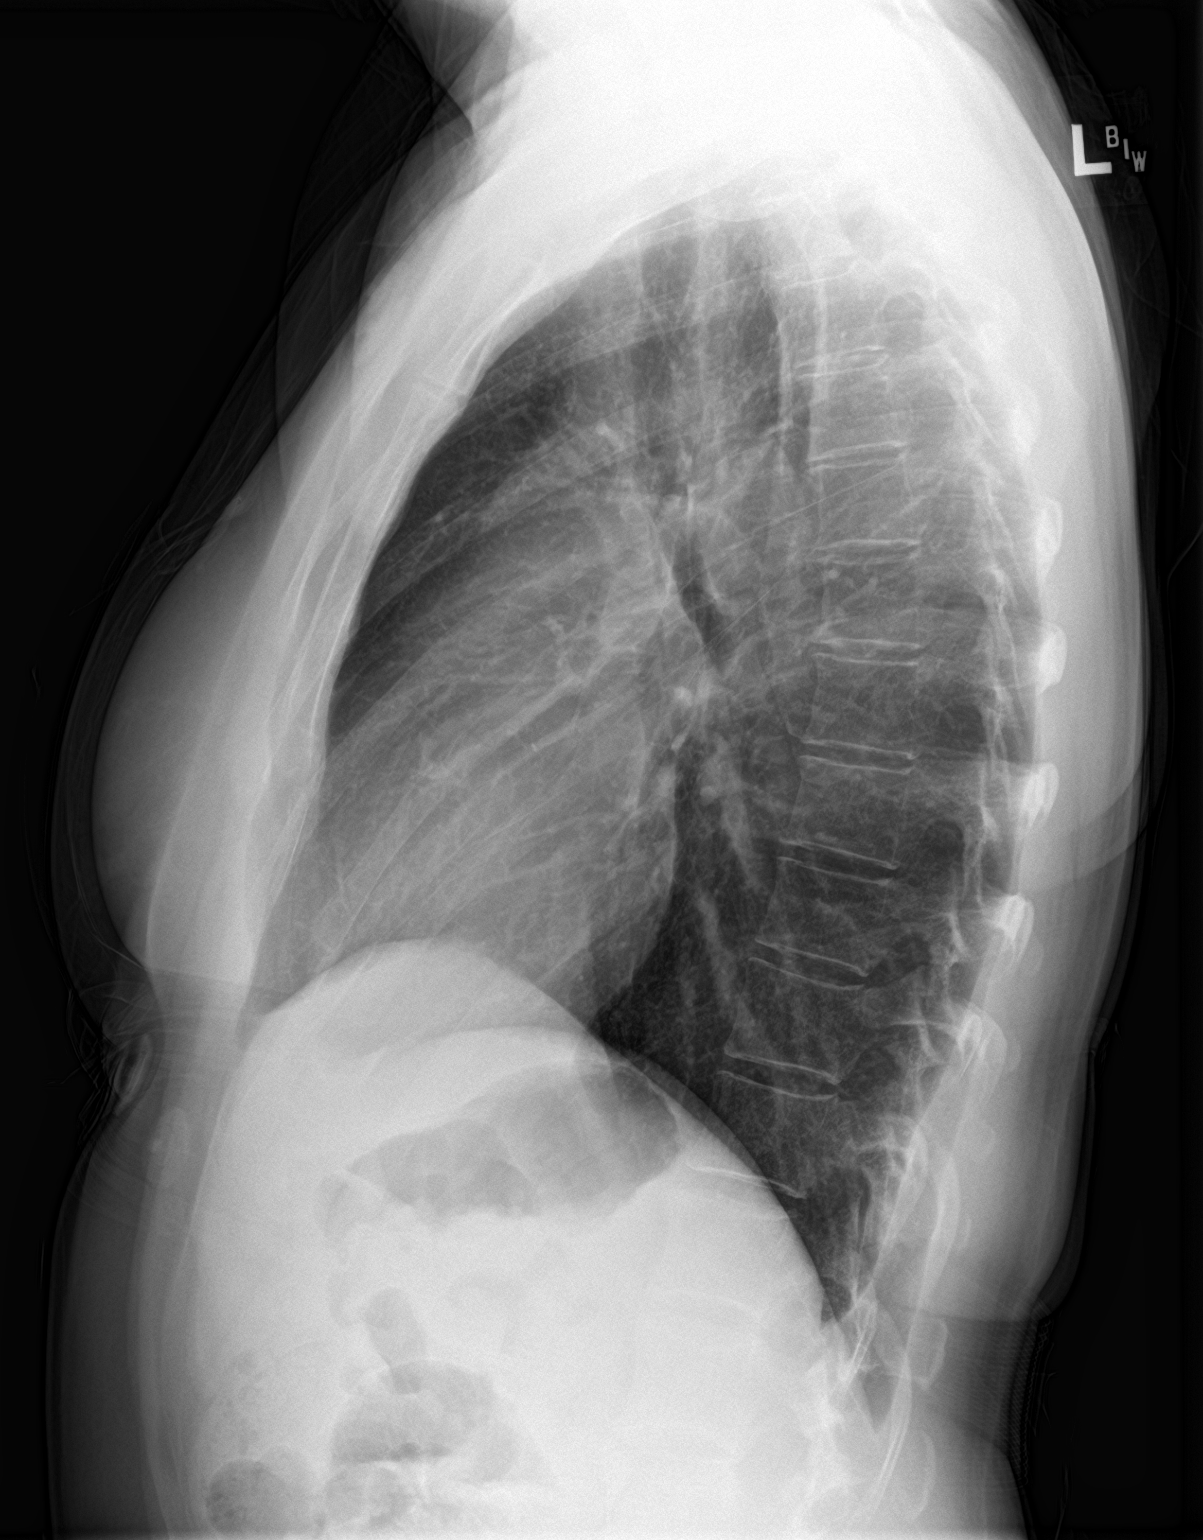

[2 of 2 positions shown; findings below may reference images not displayed]

FINDINGS: The heart size and mediastinal contours are within normal limits.
Both lungs are clear. The visualized skeletal structures are
unremarkable.
IMPRESSION: No acute abnormality of the lungs.

## 2022-04-18 ENCOUNTER — Encounter: Payer: Self-pay | Admitting: Gastroenterology

## 2022-05-14 ENCOUNTER — Ambulatory Visit: Payer: Self-pay | Admitting: Gastroenterology

## 2022-06-11 ENCOUNTER — Encounter: Payer: Self-pay | Admitting: Internal Medicine

## 2022-06-11 ENCOUNTER — Ambulatory Visit (INDEPENDENT_AMBULATORY_CARE_PROVIDER_SITE_OTHER): Payer: Self-pay | Admitting: Internal Medicine

## 2022-06-11 ENCOUNTER — Ambulatory Visit (INDEPENDENT_AMBULATORY_CARE_PROVIDER_SITE_OTHER)
Admission: RE | Admit: 2022-06-11 | Discharge: 2022-06-11 | Disposition: A | Discharge status: Home / Self Care | Payer: Self-pay | Source: Ambulatory Visit | Attending: Internal Medicine | Admitting: Internal Medicine

## 2022-06-11 VITALS — BP 122/70 | HR 77 | Ht 65.0 in | Wt 150.0 lb

## 2022-06-11 DIAGNOSIS — R140 Abdominal distension (gaseous): Secondary | ICD-10-CM

## 2022-06-11 DIAGNOSIS — R197 Diarrhea, unspecified: Secondary | ICD-10-CM

## 2022-06-11 DIAGNOSIS — R194 Change in bowel habit: Secondary | ICD-10-CM

## 2022-06-11 MED ORDER — NA SULFATE-K SULFATE-MG SULF 17.5-3.13-1.6 GM/177ML PO SOLN: 1.0000 | Freq: Once | ORAL | 0 refills | Status: AC

## 2022-06-11 NOTE — Progress Notes (Signed)
Chief Complaint: Change in bowel habits  HPI : 46 year old female with history of IBS, anxiety, and anemia presents with change in bowel habits  She has had changes in bowel habits over the years. She has never had regular BMs. She has alternating constipation and diarrhea. Historically she was more constipated but more recently she has had more diarrhea over the last 6 months. She has about 4-5 BMs per day when the diarrhea is particularly bad. No food triggers. She does drink 1 large coffee cup per day in addition to iced tea. Endorses about 2 glasses of wine per night. She has tried to use more non-dairy foods/beverages because she does notice that dairy seems to make her feel more bloated. She does not have substantial amounts of abdominal pain. She has hemorrhoids that come and go. She did undergo a course of antibiotics last year for a sinus infection. She has tried a fiber supplement in the past, but this caused her to have worsened bloating. Endorses family history of colon cancer in his grandfather. Denies blood in the stools. Denies dysphagia, N&V.  Wt Readings from Last 3 Encounters:  06/11/22 150 lb (68 kg)  10/01/19 130 lb (59 kg)  10/16/15 160 lb (72.6 kg)   Past Medical History:  Diagnosis Date   Anemia    Anxiety    Gestational diabetes mellitus, antepartum      Past Surgical History:  Procedure Laterality Date   ABCESS DRAINAGE     left under groin I&D   ANKLE SURGERY     WISDOM TOOTH EXTRACTION     Family History  Problem Relation Age of Onset   Colon cancer Paternal Grandfather    Stomach cancer Neg Hx    Esophageal cancer Neg Hx    Colon polyps Neg Hx    Social History   Tobacco Use   Smoking status: Never   Smokeless tobacco: Never  Vaping Use   Vaping Use: Never used  Substance Use Topics   Alcohol use: Yes    Comment: socially   Drug use: No   Current Outpatient Medications  Medication Sig Dispense Refill   FLUoxetine (PROZAC) 20 MG capsule  Take 20 mg by mouth daily.     levonorgestrel (MIRENA, 52 MG,) 20 MCG/DAY IUD Take 1 device by intrauterine route.     Prenatal Multivit-Min-Fe-FA (PRENATAL VITAMINS PO) Take 1 each by mouth daily. (Patient not taking: Reported on 06/11/2022)     No current facility-administered medications for this visit.   No Known Allergies   Review of Systems: All systems reviewed and negative except where noted in HPI.   Physical Exam: BP 122/70   Pulse 77   Ht 5\' 5"  (1.651 m)   Wt 150 lb (68 kg)   SpO2 97%   BMI 24.96 kg/m  Constitutional: Pleasant,well-developed, female in no acute distress. HEENT: Normocephalic and atraumatic. Conjunctivae are normal. No scleral icterus. Cardiovascular: Normal rate, regular rhythm.  Pulmonary/chest: Effort normal and breath sounds normal. No wheezing, rales or rhonchi. Abdominal: Soft, nondistended, nontender. Bowel sounds active throughout. There are no masses palpable. No hepatomegaly. Extremities: No edema Neurological: Alert and oriented to person place and time. Skin: Skin is warm and dry. No rashes noted. Psychiatric: Normal mood and affect. Behavior is normal.  Labs 09/2019: BMP with low sodium of 130. CBC with low Hb of 11.6.  ASSESSMENT AND PLAN: Diarrhea Change in bowel habits Bloating Patient presents with a change in her bowel habits over the  last 6 months that has trended more towards diarrhea. Also endorses substantial bloating. Her symptoms may be due to IBS. Alternatively could consider contribution from overflow diarrhea (patient has history of alternating constipation and diarrhea) or SIBO (patient did have a course of antibiotics last year). Will institute a low FODMAP diet and plan for further evaluation by performing a KUB to evaluate underlying stool burden and hydrogen breath test to rule out SIBO. Since patient has had a change in her bowel habits and does have family history of colon cancer, will also plan for colonoscopy to rule  out an anatomic issue. - Low FODMAP diet - Check KUB - SIBO breath test - Colonoscopy [facility]  Eulah Pont, MD

## 2022-06-11 NOTE — Patient Instructions (Signed)
If you are age 44 or older, your body mass index should be between 23-30. Your Body mass index is 24.96 kg/m. If this is out of the aforementioned range listed, please consider follow up with your Primary Care Provider.  If you are age 52 or younger, your body mass index should be between 19-25. Your Body mass index is 24.96 kg/m. If this is out of the aformentioned range listed, please consider follow up with your Primary Care Provider.   Your provider has requested that you have an abdominal x ray before leaving today. Please go to the basement floor to our Radiology department for the test.  Follow Low Fodmap diet.   You have been scheduled for a colonoscopy. Please follow written instructions given to you at your visit today.  Please pick up your prep supplies at the pharmacy within the next 1-3 days. If you use inhalers (even only as needed), please bring them with you on the day of your procedure.   You have been given a testing kit to check for small intestine bacterial overgrowth (SIBO) which is completed by a company named [employer]. Make sure to return your test in the mail using the return mailing label given to you along with the kit. Your demographic and insurance information have already been sent to the company and they should be in contact with you over the next 1-2 weeks regarding this test. [facility] will collect an upfront charge of $99.74 for commercial insurance plans and $209.74 is you are paying cash. Make sure to discuss with [facility] PRIOR to having the test to see if they have gotten information from your insurance company as to how much your testing will cost out of pocket, if any. Please keep in mind that you will be getting a call from phone number [phone] or a similar number. If you do not hear from them within this time frame, please call our office at [phone] or call [facility] directly at [phone].     The [facility]  providers would like to encourage you to use [facility] to communicate with providers for non-urgent requests or questions.  Due to long hold times on the telephone, sending your provider a message by [facility] may be a faster and more efficient way to get a response.  Please allow 48 business hours for a response.  Please remember that this is for non-urgent requests.   Thank you for entrusting me with your care and for choosing [facility], Dr. Christia Reading

## 2022-06-14 ENCOUNTER — Telehealth: Payer: Self-pay

## 2022-06-14 NOTE — Telephone Encounter (Signed)
Left VM for patient on the number provided to call back.

## 2022-06-14 NOTE — Telephone Encounter (Signed)
Called patient to see if she was self pay or if she had coverage. Last insurance card in file is from 2016.

## 2022-07-22 ENCOUNTER — Encounter: Payer: Self-pay | Admitting: Internal Medicine

## 2022-08-01 ENCOUNTER — Encounter: Payer: Self-pay | Admitting: Internal Medicine

## 2022-08-16 ENCOUNTER — Telehealth: Payer: Self-pay

## 2022-08-16 ENCOUNTER — Ambulatory Visit (AMBULATORY_SURGERY_CENTER): Payer: Self-pay | Admitting: *Deleted

## 2022-08-16 DIAGNOSIS — R194 Change in bowel habit: Secondary | ICD-10-CM

## 2022-08-16 DIAGNOSIS — R197 Diarrhea, unspecified: Secondary | ICD-10-CM

## 2022-08-16 NOTE — Telephone Encounter (Signed)
Patient no show/answer PV appointment for today. I called patient, no answer, left a message for the patient to call us back today before 5 pm or the PV and procedure will be cancelled.  

## 2022-08-16 NOTE — Telephone Encounter (Signed)
Patient did not call us back. Colon and PV cancelled=no show letter mailed to pt. And sent mychart

## 2022-08-16 NOTE — Progress Notes (Signed)
Pt no show PV

## 2022-09-13 ENCOUNTER — Encounter: Payer: Self-pay | Admitting: Internal Medicine

## 2023-01-15 ENCOUNTER — Encounter: Payer: Self-pay | Admitting: Radiology

## 2023-01-15 ENCOUNTER — Other Ambulatory Visit: Payer: Self-pay | Admitting: Radiology

## 2023-01-15 ENCOUNTER — Ambulatory Visit (INDEPENDENT_AMBULATORY_CARE_PROVIDER_SITE_OTHER): Payer: Managed Care, Other (non HMO) | Admitting: Radiology

## 2023-01-15 ENCOUNTER — Other Ambulatory Visit (HOSPITAL_COMMUNITY)
Admission: RE | Admit: 2023-01-15 | Discharge: 2023-01-15 | Disposition: A | Discharge status: Home / Self Care | Payer: Managed Care, Other (non HMO) | Source: Ambulatory Visit | Attending: Radiology | Admitting: Radiology

## 2023-01-15 VITALS — BP 118/80 | Ht 64.75 in | Wt 155.0 lb

## 2023-01-15 DIAGNOSIS — Z01419 Encounter for gynecological examination (general) (routine) without abnormal findings: Secondary | ICD-10-CM | POA: Diagnosis not present

## 2023-01-15 DIAGNOSIS — Z1231 Encounter for screening mammogram for malignant neoplasm of breast: Secondary | ICD-10-CM

## 2023-01-15 NOTE — Progress Notes (Signed)
   Joan [name] 02-Jan-1976 [id number]   History:  47 y.o. G1P1 presents for annual exam. No gyn concerns, transfer from [facility].  Gynecologic History No LMP recorded. (Menstrual status: IUD).   Contraception/Family planning: IUD Sexually active: yes Last Pap: 2019. Results were: normal Last mammogram: 2022. Results were: normal  Obstetric History OB History  Gravida Para Term Preterm AB Living  1 1 1     1  $ SAB IAB Ectopic Multiple Live Births        0 1    # Outcome Date GA Lbr Len/2nd Weight Sex Delivery Anes PTL Lv  1 Term 10/16/15 65w1d10:31 / 02:22 7 lb 12.2 oz (3.521 kg) M Vag-Spont EPI  LIV     The following portions of the patient's history were reviewed and updated as appropriate: allergies, current medications, past family history, past medical history, past social history, past surgical history, and problem list.  Review of Systems Pertinent items noted in HPI and remainder of comprehensive ROS otherwise negative.   Past medical history, past surgical history, family history and social history were all reviewed and documented in the EPIC chart.   Exam:  Vitals:   01/15/23 0959  BP: 118/80  Weight: 155 lb (70.3 kg)  Height: 5' 4.75" (1.645 m)   Body mass index is 25.99 kg/m.  General appearance:  Normal Thyroid:  Symmetrical, normal in size, without palpable masses or nodularity. Respiratory  Auscultation:  Clear without wheezing or rhonchi Cardiovascular  Auscultation:  Regular rate, without rubs, murmurs or gallops  Edema/varicosities:  Not grossly evident Abdominal  Soft,nontender, without masses, guarding or rebound.  Liver/spleen:  No organomegaly noted  Hernia:  None appreciated  Skin  Inspection:  Grossly normal Breasts: Examined lying and sitting.   Right: Without masses, retractions, nipple discharge or axillary adenopathy.   Left: Without masses, retractions, nipple discharge or axillary adenopathy. Genitourinary   Inguinal/mons:  Normal  without inguinal adenopathy  External genitalia:  Normal appearing vulva with no masses, tenderness, or lesions  BUS/Urethra/Skene's glands:  Normal without masses or exudate  Vagina:  Normal appearing with normal color and discharge, no lesions  Cervix:  Normal appearing without discharge or lesions  Uterus:  Normal in size, shape and contour.  Mobile, nontender  Adnexa/parametria:     Rt: Normal in size, without masses or tenderness.   Lt: Normal in size, without masses or tenderness.  Anus and perineum: Normal   Patient informed chaperone available to be present for breast and pelvic exam. Patient has requested no chaperone to be present. Patient has been advised what will be completed during breast and pelvic exam.   Assessment/Plan:   1. Well woman exam with routine gynecological exam - [facility] - Cytology - PAP( [place]) - colonoscopy at 466  Discussed SBE, colonoscopy and DEXA screening as directed/appropriate. Recommend 1549ms of exercise weekly, including weight bearing exercise. Encouraged the use of seatbelts and sunscreen. Return in 1 year for annual or as needed.   CHRubbie Battiest WHNP-BC 10:24 AM 01/15/2023

## 2023-01-16 LAB — CYTOLOGY - PAP
Adequacy: ABSENT
Comment: NEGATIVE
Diagnosis: NEGATIVE
High risk HPV: NEGATIVE

## 2023-02-06 ENCOUNTER — Ambulatory Visit
Admission: RE | Admit: 2023-02-06 | Discharge: 2023-02-06 | Disposition: A | Discharge status: Home / Self Care | Payer: Managed Care, Other (non HMO) | Source: Ambulatory Visit | Attending: Radiology | Admitting: Radiology

## 2023-02-06 DIAGNOSIS — Z1231 Encounter for screening mammogram for malignant neoplasm of breast: Secondary | ICD-10-CM

## 2023-02-10 ENCOUNTER — Other Ambulatory Visit: Payer: Self-pay | Admitting: Radiology

## 2023-02-10 DIAGNOSIS — R928 Other abnormal and inconclusive findings on diagnostic imaging of breast: Secondary | ICD-10-CM

## 2023-02-20 ENCOUNTER — Ambulatory Visit
Admission: RE | Admit: 2023-02-20 | Discharge: 2023-02-20 | Disposition: A | Discharge status: Home / Self Care | Payer: Managed Care, Other (non HMO) | Source: Ambulatory Visit | Attending: Radiology | Admitting: Radiology

## 2023-02-20 ENCOUNTER — Other Ambulatory Visit: Payer: Self-pay | Admitting: Radiology

## 2023-02-20 DIAGNOSIS — N6489 Other specified disorders of breast: Secondary | ICD-10-CM

## 2023-02-20 DIAGNOSIS — N6310 Unspecified lump in the right breast, unspecified quadrant: Secondary | ICD-10-CM

## 2023-02-20 DIAGNOSIS — R928 Other abnormal and inconclusive findings on diagnostic imaging of breast: Secondary | ICD-10-CM

## 2023-08-21 ENCOUNTER — Ambulatory Visit
Admission: RE | Admit: 2023-08-21 | Discharge: 2023-08-21 | Disposition: A | Discharge status: Home / Self Care | Payer: Managed Care, Other (non HMO) | Source: Ambulatory Visit | Attending: Radiology | Admitting: Radiology

## 2023-08-21 DIAGNOSIS — N6310 Unspecified lump in the right breast, unspecified quadrant: Secondary | ICD-10-CM

## 2023-08-21 DIAGNOSIS — R928 Other abnormal and inconclusive findings on diagnostic imaging of breast: Secondary | ICD-10-CM

## 2023-08-21 DIAGNOSIS — N6489 Other specified disorders of breast: Secondary | ICD-10-CM

## 2023-08-22 ENCOUNTER — Other Ambulatory Visit: Payer: Self-pay | Admitting: Radiology

## 2023-08-22 DIAGNOSIS — N6310 Unspecified lump in the right breast, unspecified quadrant: Secondary | ICD-10-CM

## 2024-02-04 ENCOUNTER — Encounter: Payer: Self-pay | Admitting: Radiology

## 2024-02-04 ENCOUNTER — Ambulatory Visit (INDEPENDENT_AMBULATORY_CARE_PROVIDER_SITE_OTHER): Payer: Self-pay | Admitting: Radiology

## 2024-02-04 VITALS — BP 104/68 | HR 79 | Ht 65.0 in | Wt 149.8 lb

## 2024-02-04 DIAGNOSIS — Z1331 Encounter for screening for depression: Secondary | ICD-10-CM

## 2024-02-04 DIAGNOSIS — Z1211 Encounter for screening for malignant neoplasm of colon: Secondary | ICD-10-CM

## 2024-02-04 DIAGNOSIS — Z01419 Encounter for gynecological examination (general) (routine) without abnormal findings: Secondary | ICD-10-CM

## 2024-02-04 NOTE — Patient Instructions (Signed)
 Preventive Care 16-46 Years Old, Female  Preventive care refers to lifestyle choices and visits with your health care provider that can promote health and wellness. Preventive care visits are also called wellness exams.  What can I expect for my preventive care visit?  Counseling  Your health care provider may ask you questions about your:  Medical history, including:  Past medical problems.  Family medical history.  Pregnancy history.  Current health, including:  Menstrual cycle.  Method of birth control.  Emotional well-being.  Home life and relationship well-being.  Sexual activity and sexual health.  Lifestyle, including:  Alcohol, nicotine or tobacco, and drug use.  Access to firearms.  Diet, exercise, and sleep habits.  Work and work Astronomer.  Sunscreen use.  Safety issues such as seatbelt and bike helmet use.  Physical exam  Your health care provider will check your:  Height and weight. These may be used to calculate your BMI (body mass index). BMI is a measurement that tells if you are at a healthy weight.  Waist circumference. This measures the distance around your waistline. This measurement also tells if you are at a healthy weight and may help predict your risk of certain diseases, such as type 2 diabetes and high blood pressure.  Heart rate and blood pressure.  Body temperature.  Skin for abnormal spots.  What immunizations do I need?    Vaccines are usually given at various ages, according to a schedule. Your health care provider will recommend vaccines for you based on your age, medical history, and lifestyle or other factors, such as travel or where you work.  What tests do I need?  Screening  Your health care provider may recommend screening tests for certain conditions. This may include:  Lipid and cholesterol levels.  Diabetes screening. This is done by checking your blood sugar (glucose) after you have not eaten for a while (fasting).  Pelvic exam and Pap test.  Hepatitis B test.  Hepatitis C  test.  HIV (human immunodeficiency virus) test.  STI (sexually transmitted infection) testing, if you are at risk.  Lung cancer screening.  Colorectal cancer screening.  Mammogram. Talk with your health care provider about when you should start having regular mammograms. This may depend on whether you have a family history of breast cancer.  BRCA-related cancer screening. This may be done if you have a family history of breast, ovarian, tubal, or peritoneal cancers.  Bone density scan. This is done to screen for osteoporosis.  Talk with your health care provider about your test results, treatment options, and if necessary, the need for more tests.  Follow these instructions at home:  Eating and drinking    Eat a diet that includes fresh fruits and vegetables, whole grains, lean protein, and low-fat dairy products.  Take vitamin and mineral supplements as recommended by your health care provider.  Do not drink alcohol if:  Your health care provider tells you not to drink.  You are pregnant, may be pregnant, or are planning to become pregnant.  If you drink alcohol:  Limit how much you have to 0-1 drink a day.  Know how much alcohol is in your drink. In the [place] one drink equals one 12 oz bottle of beer (355 mL), one 5 oz glass of wine (148 mL), or one 1 oz glass of hard liquor (44 mL).  Lifestyle  Brush your teeth every morning and night with fluoride toothpaste. Floss one time each day.  Exercise for at least  30 minutes 5 or more days each week.  Do not use any products that contain nicotine or tobacco. These products include cigarettes, chewing tobacco, and vaping devices, such as e-cigarettes. If you need help quitting, ask your health care provider.  Do not use drugs.  If you are sexually active, practice safe sex. Use a condom or other form of protection to prevent STIs.  If you do not wish to become pregnant, use a form of birth control. If you plan to become pregnant, see your health care provider for a  prepregnancy visit.  Take aspirin only as told by your health care provider. Make sure that you understand how much to take and what form to take. Work with your health care provider to find out whether it is safe and beneficial for you to take aspirin daily.  Find healthy ways to manage stress, such as:  Meditation, yoga, or listening to music.  Journaling.  Talking to a trusted person.  Spending time with friends and family.  Minimize exposure to UV radiation to reduce your risk of skin cancer.  Safety  Always wear your seat belt while driving or riding in a vehicle.  Do not drive:  If you have been drinking alcohol. Do not ride with someone who has been drinking.  When you are tired or distracted.  While texting.  If you have been using any mind-altering substances or drugs.  Wear a helmet and other protective equipment during sports activities.  If you have firearms in your house, make sure you follow all gun safety procedures.  Seek help if you have been physically or sexually abused.  What's next?  Visit your health care provider once a year for an annual wellness visit.  Ask your health care provider how often you should have your eyes and teeth checked.  Stay up to date on all vaccines.  This information is not intended to replace advice given to you by your health care provider. Make sure you discuss any questions you have with your health care provider.  Document Revised: 05/09/2021 Document Reviewed: 05/09/2021  Elsevier Patient Education  2024 [employer].

## 2024-02-04 NOTE — Progress Notes (Signed)
 Joan [name] 07-29-1976 [id number]   History:  48 y.o. G1P1 presents for annual exam. No gyn concerns. Mirena placed 2022.  Gynecologic History No LMP recorded. (Menstrual status: IUD).   Contraception/Family planning: IUD Sexually active: yes Last Pap: 2024. Results were: normal Last mammogram: 08/21/23. Results were: abnormal  Obstetric History OB History  Gravida Para Term Preterm AB Living  1 1 1   1   SAB IAB Ectopic Multiple Live Births     0 1    # Outcome Date GA Lbr Len/2nd Weight Sex Type Anes PTL Lv  1 Term 10/16/15 [redacted]w[redacted]d 10:31 / 02:22 7 lb 12.2 oz (3.521 kg) M Vag-Spont EPI  LIV       02/04/2024    9:06 AM 08/10/2015    3:35 PM  Depression screen PHQ 2/9  Decreased Interest 0 0  Down, Depressed, Hopeless 0 0  PHQ - 2 Score 0 0     The following portions of the patient's history were reviewed and updated as appropriate: allergies, current medications, past family history, past medical history, past social history, past surgical history, and problem list.  Review of Systems  Constitutional: Negative.   Cardiovascular: Negative.   Gastrointestinal:  Negative for abdominal pain, blood in stool, constipation, nausea and vomiting.  Genitourinary: Negative.  Negative for dysuria, flank pain, frequency, hematuria and urgency.  Endo/Heme/Allergies:  Does not bruise/bleed easily.  Psychiatric/Behavioral: Negative.  Negative for depression, memory loss and substance abuse. The patient is not nervous/anxious and does not have insomnia.     Past medical history, past surgical history, family history and social history were all reviewed and documented in the EPIC chart.  Exam:  Vitals:   02/04/24 0905  BP: 104/68  Pulse: 79  SpO2: 96%  Weight: 149 lb 12.8 oz (67.9 kg)  Height: 5\' 5"  (1.651 m)   Body mass index is 24.93 kg/m.  Physical Exam Vitals and nursing note reviewed. Exam conducted with a chaperone present.  Constitutional:      Appearance: Normal  appearance. She is normal weight.  HENT:     Head: Normocephalic and atraumatic.  Neck:     Thyroid: No thyroid mass, thyromegaly or thyroid tenderness.  Cardiovascular:     Rate and Rhythm: Regular rhythm.     Heart sounds: Normal heart sounds.  Pulmonary:     Effort: Pulmonary effort is normal.     Breath sounds: Normal breath sounds.  Chest:  Breasts:    Breasts are symmetrical.     Right: Normal. No inverted nipple, mass, nipple discharge, skin change or tenderness.     Left: Normal. No inverted nipple, mass, nipple discharge, skin change or tenderness.  Abdominal:     General: Abdomen is flat. Bowel sounds are normal.     Palpations: Abdomen is soft.  Genitourinary:    General: Normal vulva.     Vagina: Normal. No vaginal discharge, bleeding or lesions.     Cervix: Normal. No discharge or lesion.     Uterus: Normal. Not enlarged and not tender.      Adnexa: Right adnexa normal and left adnexa normal.       Right: No mass, tenderness or fullness.         Left: No mass, tenderness or fullness.       Comments: IUD string seen Lymphadenopathy:     Upper Body:     Right upper body: No axillary adenopathy.     Left upper body: No axillary adenopathy.  Skin:    General: Skin is warm and dry.  Neurological:     Mental Status: She is alert and oriented to person, place, and time.  Psychiatric:        Mood and Affect: Mood normal.        Thought Content: Thought content normal.        Judgment: Judgment normal.      Raynelle Fanning, CMA present for exam  Assessment/Plan:   1. Well woman exam with routine gynecological exam (Primary) Pap 2027 Mammo as scheduled  2. Screen for colon cancer  - Cologuard    Discussed SBE, colonoscopy and pap screening as directed/appropriate. Recommend of exercise weekly, including weight bearing exercise. Encouraged the use of seatbelts and sunscreen.  Return in about 1 year (around 02/03/2025) for Annual.  Tanda Rockers  WHNP-BC 9:44 AM 02/04/2024

## 2024-02-19 ENCOUNTER — Ambulatory Visit: Admission: RE | Admit: 2024-02-19 | Payer: Managed Care, Other (non HMO) | Source: Ambulatory Visit

## 2024-02-19 ENCOUNTER — Ambulatory Visit
Admission: RE | Admit: 2024-02-19 | Discharge: 2024-02-19 | Disposition: A | Discharge status: Home / Self Care | Payer: Managed Care, Other (non HMO) | Source: Ambulatory Visit | Attending: Radiology | Admitting: Radiology

## 2024-02-19 DIAGNOSIS — N6310 Unspecified lump in the right breast, unspecified quadrant: Secondary | ICD-10-CM

## 2024-07-15 ENCOUNTER — Telehealth: Payer: Self-pay | Admitting: Internal Medicine

## 2024-07-15 NOTE — Telephone Encounter (Signed)
 Good afternoon Dr. Federico  The following patient wants to schedule a colonoscopy and she is your patient. Unfortunately, you do not have any availability for next month. PT is asking if one of your colleagues  would be will to take over the procedure. Please review and advise of scheduling. Thank you.

## 2024-07-27 ENCOUNTER — Encounter: Payer: Self-pay | Admitting: Pediatrics

## 2024-09-06 ENCOUNTER — Encounter

## 2024-09-08 ENCOUNTER — Encounter

## 2024-09-09 ENCOUNTER — Ambulatory Visit (AMBULATORY_SURGERY_CENTER): Admitting: *Deleted

## 2024-09-09 VITALS — Ht 65.0 in | Wt 140.0 lb

## 2024-09-09 DIAGNOSIS — Z1211 Encounter for screening for malignant neoplasm of colon: Secondary | ICD-10-CM

## 2024-09-09 MED ORDER — NA SULFATE-K SULFATE-MG SULF 17.5-3.13-1.6 GM/177ML PO SOLN: 1.0000 | Freq: Once | ORAL | 0 refills | Status: AC

## 2024-09-09 NOTE — Progress Notes (Signed)
 Pt's name and DOB verified at the beginning of the pre-visit with 2 identifiers  Pt denies any difficulty with ambulating,sitting, laying down or rolling side to side  Pt has no issues moving head neck or swallowing  No egg or soy allergy known to patient   No issues known to pt with past sedation  No FH of Malignant Hyperthermia  Pt is not on home 02   Pt is not on blood thinners   Pt has frequent issues with constipation RN instructed pt to use Miralax per bottles instructions a week before prep days. Pt states they will  Pt is not on dialysis  Hx of palpitations  Pt denies any upcoming cardiac testing   Chart not reviewed by CRNA prior to [facility]  Visit by phone  Pt states weight is 140 lb    Pt given  both [facility] main # and MD on call # prior to instructions.  Informed pt to come in at the time discussed and is shown on PV instructions.  Pt instructed to use Singlecare.com or GoodRx for a price reduction on prep  Instructed pt where to find PV instructions in My Ch. Copy of instructions  to be sent in mail and address read back to pt to verify correct on envelope. Instructed pt on all aspects of written instructions including med holds clothing to wear and foods to eat and not eat as well as after procedure legal restrictions and to call MD on call if needed.. Pt states understanding. Instructed pt to review instructions again prior to procedure and call main # given if has any questions or any issues. Pt states they will.

## 2024-10-01 ENCOUNTER — Encounter: Admitting: Pediatrics

## 2024-10-27 ENCOUNTER — Telehealth: Payer: Self-pay | Admitting: Pediatrics

## 2024-10-27 NOTE — Telephone Encounter (Signed)
 Inbound call from patient wanting to reschedule  her colonoscopy that she had to cancel. Patient was rescheduled for 1/12 at 8:00. Patient is requesting new prep instructions. Please advise.

## 2024-10-27 NOTE — Telephone Encounter (Signed)
 New prep instructions with new date and time (12/06/24 @ 0800) sent to patient via mychart.

## 2024-12-06 ENCOUNTER — Encounter: Admitting: Pediatrics

## 2025-02-10 ENCOUNTER — Ambulatory Visit: Admitting: Radiology

## 2025-02-17 ENCOUNTER — Encounter: Admitting: Internal Medicine
